# Patient Record
Sex: Male | Born: 1935 | Race: White | Hispanic: No | Smoking: Never smoker
Health system: Southern US, Community
[De-identification: ages and names within clinical notes are randomized; demographics above are authoritative.]

## PROBLEM LIST (undated history)

## (undated) DIAGNOSIS — I639 Cerebral infarction, unspecified: Secondary | ICD-10-CM

## (undated) DIAGNOSIS — R339 Retention of urine, unspecified: Secondary | ICD-10-CM

## (undated) DIAGNOSIS — T24229A Burn of second degree of unspecified knee, initial encounter: Secondary | ICD-10-CM

## (undated) DIAGNOSIS — S41109A Unspecified open wound of unspecified upper arm, initial encounter: Secondary | ICD-10-CM

## (undated) DIAGNOSIS — H9190 Unspecified hearing loss, unspecified ear: Secondary | ICD-10-CM

## (undated) DIAGNOSIS — IMO0001 Reserved for inherently not codable concepts without codable children: Secondary | ICD-10-CM

## (undated) DIAGNOSIS — F0390 Unspecified dementia without behavioral disturbance: Secondary | ICD-10-CM

## (undated) HISTORY — PX: HEMORRHOID SURGERY: SHX153

## (undated) HISTORY — PX: CATARACT EXTRACTION: SUR2

---

## 1997-07-11 ENCOUNTER — Encounter: Admission: RE | Admit: 1997-07-11 | Discharge: 1997-10-09 | Payer: Self-pay | Admitting: Orthopedic Surgery

## 1997-07-25 ENCOUNTER — Inpatient Hospital Stay (HOSPITAL_COMMUNITY): Admission: RE | Admit: 1997-07-25 | Discharge: 1997-07-30 | Payer: Self-pay | Admitting: Orthopedic Surgery

## 1997-08-05 ENCOUNTER — Other Ambulatory Visit: Admission: RE | Admit: 1997-08-05 | Discharge: 1997-08-05 | Payer: Self-pay | Admitting: Orthopedic Surgery

## 1997-08-13 ENCOUNTER — Other Ambulatory Visit: Admission: RE | Admit: 1997-08-13 | Discharge: 1997-08-13 | Payer: Self-pay | Admitting: Orthopedic Surgery

## 1997-12-04 ENCOUNTER — Ambulatory Visit: Admission: RE | Admit: 1997-12-04 | Discharge: 1997-12-04 | Payer: Self-pay | Admitting: Family Medicine

## 1999-01-15 ENCOUNTER — Ambulatory Visit (HOSPITAL_COMMUNITY): Admission: RE | Admit: 1999-01-15 | Discharge: 1999-01-15 | Payer: Self-pay | Admitting: *Deleted

## 2000-09-16 ENCOUNTER — Encounter: Payer: Self-pay | Admitting: Neurosurgery

## 2000-09-16 ENCOUNTER — Encounter: Payer: Self-pay | Admitting: Emergency Medicine

## 2000-09-16 ENCOUNTER — Inpatient Hospital Stay (HOSPITAL_COMMUNITY): Admission: EM | Admit: 2000-09-16 | Discharge: 2000-09-20 | Payer: Self-pay | Admitting: Emergency Medicine

## 2000-09-18 ENCOUNTER — Encounter: Payer: Self-pay | Admitting: Neurosurgery

## 2000-10-21 ENCOUNTER — Encounter: Payer: Self-pay | Admitting: Neurosurgery

## 2000-10-21 ENCOUNTER — Encounter: Admission: RE | Admit: 2000-10-21 | Discharge: 2000-10-21 | Payer: Self-pay | Admitting: Neurosurgery

## 2000-12-30 ENCOUNTER — Encounter: Payer: Self-pay | Admitting: Neurosurgery

## 2000-12-30 ENCOUNTER — Encounter: Admission: RE | Admit: 2000-12-30 | Discharge: 2000-12-30 | Payer: Self-pay | Admitting: Neurosurgery

## 2001-10-12 ENCOUNTER — Inpatient Hospital Stay (HOSPITAL_COMMUNITY): Admission: EM | Admit: 2001-10-12 | Discharge: 2001-10-16 | Payer: Self-pay | Admitting: Emergency Medicine

## 2001-10-12 ENCOUNTER — Encounter (INDEPENDENT_AMBULATORY_CARE_PROVIDER_SITE_OTHER): Payer: Self-pay | Admitting: Cardiology

## 2001-10-12 ENCOUNTER — Encounter: Payer: Self-pay | Admitting: Emergency Medicine

## 2001-10-12 ENCOUNTER — Encounter: Payer: Self-pay | Admitting: *Deleted

## 2001-10-14 ENCOUNTER — Encounter: Payer: Self-pay | Admitting: *Deleted

## 2001-12-23 ENCOUNTER — Emergency Department (HOSPITAL_COMMUNITY): Admission: EM | Admit: 2001-12-23 | Discharge: 2001-12-24 | Payer: Self-pay | Admitting: Emergency Medicine

## 2001-12-24 ENCOUNTER — Encounter: Payer: Self-pay | Admitting: Emergency Medicine

## 2002-02-25 ENCOUNTER — Emergency Department (HOSPITAL_COMMUNITY): Admission: EM | Admit: 2002-02-25 | Discharge: 2002-02-25 | Payer: Self-pay | Admitting: Emergency Medicine

## 2002-02-25 ENCOUNTER — Encounter: Payer: Self-pay | Admitting: Emergency Medicine

## 2002-09-26 ENCOUNTER — Observation Stay (HOSPITAL_COMMUNITY): Admission: EM | Admit: 2002-09-26 | Discharge: 2002-09-27 | Payer: Self-pay | Admitting: Emergency Medicine

## 2002-09-26 ENCOUNTER — Encounter: Payer: Self-pay | Admitting: Internal Medicine

## 2002-09-27 ENCOUNTER — Encounter (INDEPENDENT_AMBULATORY_CARE_PROVIDER_SITE_OTHER): Payer: Self-pay | Admitting: Cardiology

## 2003-04-11 ENCOUNTER — Encounter: Admission: RE | Admit: 2003-04-11 | Discharge: 2003-04-11 | Payer: Self-pay | Admitting: Dermatology

## 2003-07-01 ENCOUNTER — Inpatient Hospital Stay (HOSPITAL_COMMUNITY): Admission: AD | Admit: 2003-07-01 | Discharge: 2003-07-07 | Payer: Self-pay | Admitting: Orthopedic Surgery

## 2004-06-01 ENCOUNTER — Encounter: Admission: RE | Admit: 2004-06-01 | Discharge: 2004-06-01 | Payer: Self-pay | Admitting: Gastroenterology

## 2005-09-30 ENCOUNTER — Ambulatory Visit (HOSPITAL_COMMUNITY): Admission: RE | Admit: 2005-09-30 | Discharge: 2005-09-30 | Payer: Self-pay | Admitting: Urology

## 2006-06-13 ENCOUNTER — Emergency Department (HOSPITAL_COMMUNITY): Admission: EM | Admit: 2006-06-13 | Discharge: 2006-06-14 | Payer: Self-pay | Admitting: Emergency Medicine

## 2006-06-18 ENCOUNTER — Emergency Department (HOSPITAL_COMMUNITY): Admission: EM | Admit: 2006-06-18 | Discharge: 2006-06-18 | Payer: Self-pay | Admitting: Emergency Medicine

## 2006-06-24 ENCOUNTER — Emergency Department (HOSPITAL_COMMUNITY): Admission: EM | Admit: 2006-06-24 | Discharge: 2006-06-24 | Payer: Self-pay | Admitting: Emergency Medicine

## 2006-07-26 ENCOUNTER — Encounter: Admission: RE | Admit: 2006-07-26 | Discharge: 2006-08-24 | Payer: Self-pay | Admitting: Family Medicine

## 2007-02-13 ENCOUNTER — Emergency Department (HOSPITAL_COMMUNITY): Admission: EM | Admit: 2007-02-13 | Discharge: 2007-02-13 | Payer: Self-pay | Admitting: *Deleted

## 2007-04-21 ENCOUNTER — Encounter (HOSPITAL_BASED_OUTPATIENT_CLINIC_OR_DEPARTMENT_OTHER): Admission: RE | Admit: 2007-04-21 | Discharge: 2007-07-20 | Payer: Self-pay | Admitting: Surgery

## 2007-07-28 ENCOUNTER — Encounter (HOSPITAL_BASED_OUTPATIENT_CLINIC_OR_DEPARTMENT_OTHER): Admission: RE | Admit: 2007-07-28 | Discharge: 2007-10-26 | Payer: Self-pay | Admitting: Surgery

## 2007-10-30 ENCOUNTER — Encounter (HOSPITAL_BASED_OUTPATIENT_CLINIC_OR_DEPARTMENT_OTHER): Admission: RE | Admit: 2007-10-30 | Discharge: 2008-01-28 | Payer: Self-pay | Admitting: Internal Medicine

## 2010-05-02 ENCOUNTER — Encounter: Payer: Self-pay | Admitting: Dermatology

## 2010-08-25 NOTE — Discharge Summary (Signed)
NAME:  RACHIT, GRIM NO.:  [id number]   MEDICAL RECORD NO.:  [id number]          PATIENT TYPE:  REC   LOCATION:  FOOT                         FACILITY:  [facility]   PHYSICIAN:  Barry Dienes. Eloise Harman, M.D.DATE OF BIRTH:  1919-05-23   DATE OF ADMISSION:  10/30/2007  DATE OF DISCHARGE:                               DISCHARGE SUMMARY   SUBJECTIVE:  The patient is an 77 year old Caucasian man who has had a  chronic wound on his left elbow.  At his last visit, he had an OASIS  protocol applied that was covered with Mepitel and steri-stripped into  place and then wrapped in fishnet dressing.  He has not had significant  pain in the area and has not had any fever or chills.   OBJECTIVE:  Along the posterior aspect of the left elbow in the center  area of the skin graft there was a large area of eschar that initially  measured  approximately 1.5 cm x 0.6 cm.  There was no surrounding  erythema or exudate.  Today's vital signs include a blood pressure of  124/61, pulse 90, respirations 16, temperature 97.4.   With a #15 scalpel, the eschar was entirely removed, revealing a nearly  resolved ulcer.  The remaining ulcer measured 0.9 cm x 0.5 cm with no  significant depth.  There was a red wound base and no significant  discharge.  There was pink healing tissue surrounding the ulcer.   ASSESSMENT:  Significant improvement in left elbow ulcer.   PLAN:  He was again treated with the OASIS protocol, covered by Mepitel,  Steri-Strips, and a fishnet wrap.  He was advised to have a physician  reevaluation in approximately 1 week.           ______________________________  Barry Dienes. Eloise Harman, M.D.     DGP/MEDQ  D:  11/21/2007  T:  11/21/2007  Job:  [phone]

## 2010-08-25 NOTE — Assessment & Plan Note (Signed)
[facility]   NAME:  KENDAN, CORNFORTH NO.:  [id number]   MEDICAL RECORD NO.:  [id number]      DATE OF BIRTH:  1919/05/09   PHYSICIAN:  Theresia Majors. Tanda Rockers, M.D.      VISIT DATE:                                   OFFICE VISIT   SUBJECTIVE:  Mr. Ahonen is an 77 year old man whom we have been  following for an infective ulcerated bursitis.  In the interim, we have  treated him with daily antibacterial soap and a nonadhesive dressing.  He has been seen by the home health nurse x3 a week.  There has been no  interim fever.  He continues to have a normal range of motion.  There  has been no pain.  He continues to have serous drainage.   OBJECTIVE:  VITAL SIGNS:  Blood pressure 140/72, respiration 16, pulse  rate 108, and temperature 98.4.  He is accompanied by his case Production designer, theatre/television/film.  EXTREMITIES:  Inspection of the left elbow shows that the wound  continues to contract.  There is 100% healthy granulation with advancing  epithelium from the periphery.  There is no longer exposure of the  capsule of the elbow.  The neurovascular exam is normal.   ASSESSMENT:  Clincial improvement.   PLAN:  We will continue the antibacterial soap washings, rinses, and the  nonadhesive dressings.  We will reevaluate him in one month.  The home  health nurse will continue to see him and assist in dressing changes 3  times a week.      Harold A. Tanda Rockers, M.D.  Electronically Signed     HAN/MEDQ  D:  09/19/2007  T:  09/20/2007  Job:  [id number]

## 2010-08-25 NOTE — Assessment & Plan Note (Signed)
[facility]   NAME:  BRAYANT, DORR NO.:  [id number]   MEDICAL RECORD NO.:  [id number]      DATE OF BIRTH:  Nov 18, 1919   PHYSICIAN:  Maxwell Caul, M.D. VISIT DATE:  11/02/2007                                   OFFICE VISIT   [name] is a gentleman who has been followed here for most of this  year.  He has a wound on his left elbow, which is I think initially felt  to be a consequence of a chronic bursitis, for which he was operated on.  I do not have all of these early details at my finger tips at the  present.  In any case, he also has some form of inflammatory dermatitis  over his forearm.  He was able to say that his primary physician, Dr.  Clarene Duke, thought this was eczema.  He does not have it on any other areas  of his body.  He did see a dermatologist who referred him to the [facility]  and he apparently had a biopsy of these areas, perhaps thinking this was  some form of cutaneous lymphoma; however, the biopsy was apparently  negative.  In any case, there is what appears to be some of this waxy  dermatitis actually around the wound.   On examination, he is afebrile.  The wound on his left elbow measures  2.5 x 0.5 x 0.4.  This was surrounded by callus and had a mild amount of  adherent eschar in the base of the wound, all of which I debrided.  I  then applied OASIS saline, covered the area with Mepitel, and a fishnet  wrap.  I advised him not to get this wet for the next week.  The  dermatitis is essentially as described above, some of these appear as  waxy plaques.  I see Dr. Janyth Pupa had attempted to apply hydrocortisone  to this early in the treatment, I am really not sure of the effect.   IMPRESSION:  Surgical wound, left elbow, nonhealing that has had an  OASIS protocol applied, covered in Mepitel and Steri-Stripped into  place.  We then fishnet wrapped this area.  I am going to try and do  some research into what  Dermatology felt was the etiology of some of the  surrounding dermatitis and dermatitis on the left forearm.  We will see  him again in a week.           ______________________________  Maxwell Caul, M.D.     MGR/MEDQ  D:  11/02/2007  T:  11/03/2007  Job:  [id number]

## 2010-08-25 NOTE — Assessment & Plan Note (Signed)
[facility]   NAME:  Dylan Blair, SCHREIER NO.:  [id number]   MEDICAL RECORD NO.:  [id number]      DATE OF BIRTH:  [birth year]   PHYSICIAN:  Theresia Majors. Tanda Rockers, M.D. VISIT DATE:  06/05/2007                                   OFFICE VISIT   SUBJECTIVE:  Mr. Blue is an 77 year old man who we are following for a  left ulcerative bursitis.  In the interim, we have treated him with a  silver matrix dressing.  There has been moderate drainage and some  malodor.  He continues to have a normal range of motion.  He denies  temperature.   OBJECTIVE:  Blood pressure is 138/68, respirations are normal, pulse  rates 104, temperature is 97.4.  Capillary blood glucose is  nonapplicable.  Inspection of the left elbow shows that there are areas  of necrosis surrounded by advancing epithelium and granulation.  The  range of motion is normal.  The radial and brachial pulses are +3.  Under an anesthetic mixture, the wound was debrided utilizing rongeurs  and a 10 blade.  Hemorrhage was controlled with direct pressure.  Thereafter, the wound was irrigated copiously with Dakin's solution  followed by saline solution and moist-moist dressing was applied.   ASSESSMENT:  Clinical improvement of the ulcerative bursitis.   PLAN:  We are discontinuing the silver matrix dressing.  We will begin  daily antiseptic soap washes with Dakin's irrigation and moist -moist  saline dressings.  We will reevaluate the patient in one week.      Harold A. Tanda Rockers, M.D.  Electronically Signed     HAN/MEDQ  D:  06/05/2007  T:  06/06/2007  Job:  [id number]

## 2010-08-25 NOTE — Assessment & Plan Note (Signed)
[facility]   NAME:  Dylan Blair, Dylan Blair NO.:  [id number]   MEDICAL RECORD NO.:  [id number]      DATE OF BIRTH:  [birth year]   PHYSICIAN:  Theresia Majors. Tanda Rockers, M.D. VISIT DATE:  05/08/2007                                   OFFICE VISIT   SUBJECTIVE:  [name] is an 77 year old man who we are treating for  ulcerative bursitis involving his left elbow. In the interim he has been  seen by the [facility] nurse with daily irrigations and packing.  His  management has been complicated by persistent bleeding with each  dressing change.  The patient continues on antiplatelet therapy.  There  has been no fever.  He continues to have full range of motion of the  elbow.   OBJECTIVE:  VITALS:  Blood pressure is 140/66, respirations 16, pulse  rate 90, temperature is 97.6.  Inspection of the left elbow shows that  the wound measurement wise is essentially unchanged.  There is no  evidence of loculation of abscess. Upon manipulation of the inferior rim  of the ulceration there is persistent bleeding from the edge.  The area  was cleansed, painted with Betadine and infiltrated with 1% Xylocaine.  Thereafter multiple horizontal mattress sutures were used to control  bleeding from the edge.  Thereafter the wound was irrigated and a loose  packing a plain new gauze was applied.   ASSESSMENT:  Ulcerative bursitis. No active infection.   PLAN:  We will continue the home health nurses daily irrigation and  loose packing. We will reevaluate the patient in 1 week.  We have not  started him on antibiotics.      Harold A. Tanda Rockers, M.D.  Electronically Signed     HAN/MEDQ  D:  05/08/2007  T:  05/09/2007  Job:  [id number]

## 2010-08-25 NOTE — Assessment & Plan Note (Signed)
[facility]   NAME:  Dylan Blair, Dylan Blair NO.:  [id number]   MEDICAL RECORD NO.:  [id number]      DATE OF BIRTH:  [birth year]   PHYSICIAN:  Theresia Majors. Tanda Rockers, M.D. VISIT DATE:  05/22/2007                                   OFFICE VISIT   SUBJECTIVE:  Dylan Blair is an 77 year old man with ulcerative bursitis  involving his left elbow.  In the interim we have treated him with moist-  to-moist dressings, being administered primarily by the Home Health  nurse.  There has been no interim bleeding.  There has been no pain.  He  maintains a relatively normal range of motion.   OBJECTIVE:  Blood pressure 124/65, respirations 16, pulse rate 90,  temperature 91.  Inspection of the left elbow shows that there has been  some necrosis at the periphery, with some drying out of the fibrous  capsule; with clear advancing granulation tissue at multiple positions  in the periphery.  There is no active drainage.  There is no ascending  cellulitis.  There is no fluctuance or abscess.   We have anesthetized the wound with an EMLA.  Thereafter a sharp  excisional debridement of the nonviable fibrotic subcutaneous tissue was  performed.  Minimum hemorrhage was controlled with direct pressure.   ASSESSMENT:  Clinical improvement.   PLAN:  We will begin using a Prisma silver matrix dressing with  hydrogel, to be changed every 3 days.  We will reevaluate the patient in  2 weeks p.r.n.      Harold A. Tanda Rockers, M.D.  Electronically Signed     HAN/MEDQ  D:  05/22/2007  T:  05/23/2007  Job:  [id number]

## 2010-08-25 NOTE — Assessment & Plan Note (Signed)
[facility]   NAME:  Dylan Blair, Dylan Blair NO.:  [id number]   MEDICAL RECORD NO.:  [id number]      DATE OF BIRTH:  Feb 09, 1920   PHYSICIAN:  Theresia Majors. Tanda Rockers, M.D. VISIT DATE:  05/15/2007                                   OFFICE VISIT   SUBJECTIVE:  Mr. Hollenbach is an 77 year old man who we are treating for an  infected left bursa of the elbow.  During his last visit we over-sewed  the edge of his wound to guard against bleeding and instituted a  continuation of a daily moist-moist packing.  He has continued on this  regimen.  There has been no excessive drainage malodor pain or fever.  He continues to be able to use the elbow.   OBJECTIVE:  Blood pressure is 129/68, respirations 18, pulse rate 85,  temperature 97.6.  Inspection of the left elbow shows that the open wound is clean.  There  is a preponderance of collagenous material that is not malodorous.  There is no necrosis.  A debridement is not needed.  The sutures are  intact and there has been no evidence of recent hemorrhage.   ASSESSMENT:  Improved wound.   PLAN:  We will continue daily irrigations and moist-moist dressings.  We  will reevaluate the patient in 1 week.      Harold A. Tanda Rockers, M.D.  Electronically Signed     HAN/MEDQ  D:  05/15/2007  T:  05/16/2007  Job:  [id number]

## 2010-08-25 NOTE — Group Therapy Note (Signed)
NAME:  Dylan Blair, Dylan Blair NO.:  [id number]   MEDICAL RECORD NO.:  [id number]          PATIENT TYPE:  REC   LOCATION:  FOOT                         FACILITY:  [facility]   PHYSICIAN:  Barry Dienes. Eloise Harman, M.D.DATE OF BIRTH:  12/12/1919                                 PROGRESS NOTE   SUBJECTIVE:  The patient is an 77 year old Caucasian man who at last visit had an  OASIS protocol applied to a left elbow graft that was covered with  Mepitel and a fishnet dressing.  He has not had significant pain in the  area or fever or chills.   OBJECTIVE:  VITAL SIGNS:  Blood pressure 144/69, pulse 84, respirations 16,  temperature 97.8.  Along the lateral aspect of the left elbow there is  an area of thick eschar that when removed, left a very shallow ulcer  measuring 0.4 cm x 0.3 cm x 0.1 cm.  The base of the ulcer was pale pink  in color with some red granulation tissue, no foul odor or significant  drainage.  The periwound integrity was normal.   ASSESSMENT:  Nearly healed left elbow graft ulcer.   PLAN:  The necrotic tissue around the ulcer was removed.  We will apply a thin  layer of Iodosorb covered by Telfa gauze to the area once daily.  He  will be seen in followup in approximately 1 month.           ______________________________  Barry Dienes. Eloise Harman, M.D.     DGP/MEDQ  D:  11/28/2007  T:  11/28/2007  Job:  [id number]

## 2010-08-25 NOTE — Assessment & Plan Note (Signed)
[facility]   NAME:  Dylan Blair, Dylan Blair NO.:  [id number]   MEDICAL RECORD NO.:  [id number]      DATE OF BIRTH:  09/20/1919   PHYSICIAN:  Theresia Majors. Tanda Rockers, M.D. VISIT DATE:  08/22/2007                                   OFFICE VISIT   SUBJECTIVE:  Dylan Blair is an 77 year old man who we followed for  ulcerative bursitis.  In the interim, he has been managed with daily  antibacterial soap washes of 4x4 fishnet dressing.  The home health  nurse continues to see him three times a week.  There has been no  interim excessive drainage, malodor, or fever.  He continues to have  normal range of motion.   OBJECTIVE:  Blood pressure is 129/64, respirations 18, pulse rate 96,  and temperature 98.2.  He is accompanied by the nurse.  Inspection of the left elbow shows a clean healthy-appearing granulating  wound with no evidence of periwound erythema or abscess.  There is a  normal range of motion.  There is no palpable pain.  There is no local  or excessive edema.   ASSESSMENT:  Clinical improvement with local care.   PLAN:  We will continue the use of local care.  We will utilize an  antibacterial soap and a dry dressing.  We will reevaluate the patient  in 3 weeks p.r.n.      Harold A. Tanda Rockers, M.D.  Electronically Signed     HAN/MEDQ  D:  08/22/2007  T:  08/23/2007  Job:  [id number]

## 2010-08-25 NOTE — Assessment & Plan Note (Signed)
[facility]   NAME:  HODGES, TREIBER NO.:  [id number]   MEDICAL RECORD NO.:  [id number]      DATE OF BIRTH:  11/25/1919   PHYSICIAN:  Theresia Majors. Tanda Rockers, M.D. VISIT DATE:  07/04/2007                                   OFFICE VISIT   SUBJECTIVE:  Mr. Toney is an 77 year old man who we have been treating  for ulcerative bursitis involving his left elbow.  In the interim he has  been using a Dakin's irrigation every other day and washing the wound  daily with antiseptic soap.  There has been no fever.  He continues to  have full range of motion.   OBJECTIVE:  Blood pressure is 136/62, respirations 16, pulse of 78,  temperature is 98.  Inspection of the left elbow shows essentially 100%  granulation.  There is no exposed cartilage or tendon.  There is no  undermining.  There is no malodor, no drainage.   ASSESSMENT:  Clinical improvement of the wound.   PLAN:  We will discontinue the Dakin's irrigation and initiate b.i.d.  antiseptic soap washing with a moist dressing.  We will reevaluate the  patient in 2 weeks.      Harold A. Tanda Rockers, M.D.  Electronically Signed     HAN/MEDQ  D:  07/04/2007  T:  07/04/2007  Job:  [id number]

## 2010-08-25 NOTE — Assessment & Plan Note (Signed)
[facility]   NAME:  KEIGEN, CADDELL NO.:  [id number]   MEDICAL RECORD NO.:  [id number]      DATE OF BIRTH:  12-29-1919   PHYSICIAN:  Theresia Majors. Tanda Rockers, M.D. VISIT DATE:  08/01/2007                                   OFFICE VISIT   SUBJECTIVE:  Mr. Broaddus is an 77 year old man who we followed with an  ulcerative bursitis involving his left elbow.  In the interim, he  continues to use antibacterial soap, washes daily.  The home-health  nurse is continuing to see him for irrigations and dressing changes with  more saline 3 times a week.  There has been no excessive drainage,  malodor, pain, or fever.  He continues to have full use of the elbow.   OBJECTIVE:  VITAL SIGNS:  Blood pressure is 125/60, respirations 18,  pulse rate 74, and temperature is 97.5.  EXTREMITIES:  Inspection of the left elbow shows that there is no  excessive edema.  There in no under-matting in the majority of the  circumference of the wound.  There is a 2-mm under-matting bridge  between 11 and 2 o'clock.  The wound is 100% granulated.  The wound is  moist, but it is not excessively draining.  The tissue looks healthy.  There is no local warmth or edema.   ASSESSMENT:  Clinical improvement.   PLAN:  We will continue the use of the antibacterial soap.  We will  continue the visits from the home-health  nurse 3 times a week.  We will  reevaluate the patient in 3 weeks p.r.n.      Harold A. Tanda Rockers, M.D.  Electronically Signed     HAN/MEDQ  D:  08/01/2007  T:  08/02/2007  Job:  [id number]

## 2010-08-25 NOTE — Assessment & Plan Note (Signed)
[facility]   NAME:  TORELL, MINDER NO.:  [id number]   MEDICAL RECORD NO.:  [id number]      DATE OF BIRTH:  [birth year]   PHYSICIAN:  Theresia Majors. Tanda Rockers, M.D. VISIT DATE:  07/18/2007                                   OFFICE VISIT   SUBJECTIVE:  Mr. Cass is an 77 year old man whom we have followed for  an ulcerative bursitis involving the left elbow.  In the interim, he has  utilized an antiseptic soap and daily washings. There has been no pain  in his elbow.  He continues to have essentially a normal range of  motion.  There has been no malodor.  There has been moderate  serosanguineous drainage.  He has had no fever.   OBJECTIVE:  Blood pressure is 132/68, respirations 16, pulse rate 88,  temperature 98.1.  Inspection of the left elbow shows that there is  continued contraction of a healthy granulating wound with a moderate  amount of proteinaceous soft exudate which underwent a selective  debridement utilizing a sponge gauze.  There was no significant  hemorrhage.   ASSESSMENT:  Clinical response of the ulcerative bursitis of the left  elbow.   PLAN:  We will continue his antibacterial soap wash, 4x4 and fish net.  We may decrease his frequency to three times a week for his dressing  changes.  We will see the patient in 2 weeks.  We have advised him that  if he notices an  increase drainage, pain or fever, he should call the  clinic for an interim appointment.  We have given him opportunity to ask  questions.  He seems to understand and expresses gratitude for having  been seen in the clinic and indicates that he will be compliant.      Harold A. Tanda Rockers, M.D.  Electronically Signed     HAN/MEDQ  D:  07/18/2007  T:  07/18/2007  Job:  [id number]

## 2010-08-25 NOTE — Discharge Summary (Signed)
NAME:  Dylan, Blair NO.:  [id number]   MEDICAL RECORD NO.:  [id number]          PATIENT TYPE:  REC   LOCATION:  FOOT                         FACILITY:  [facility]   PHYSICIAN:  Barry Dienes. Eloise Harman, M.D.DATE OF BIRTH:  25-Jul-1919   DATE OF ADMISSION:  10/30/2007  DATE OF DISCHARGE:                               DISCHARGE SUMMARY   SUBJECTIVE:  The patient is an 77 year old Caucasian man who has had a  chronic wound on his left elbow.  Recently, he has been treated with  Oasis covered with Mepitel and wrapped in a fishnet dressing.  He has  not had significant pain and left elbow.  He has requested evaluation by  a new dermatologists for his left forearm rash.   OBJECTIVE:  VITAL SIGNS:  Blood pressure 147/72, pulse 100, respirations  20, temperature 97.9.  EXTREMITIES:  The left elbow region at the skin graft and showed no  further ulceration.  There is a very minimal amount of dry epidermis  flaking at the wound.  There is no significant drainage or foul odor.   ASSESSMENT:  Resolved left elbow ulcer.   PLAN:  He was advised to very gently wash the left elbow with a  washcloth daily.  He is to go to a medical supply store to acquire about  a gel pad to the elbow to prevent re-ulceration.  He plans to seen an  opinion of his left forearm from Dr. Leta Speller.  He was advised to  return to the [facility] as needed if the ulcer recurs.           ______________________________  Barry Dienes Eloise Harman, M.D.     DGP/MEDQ  D:  12/26/2007  T:  12/27/2007  Job:  [id number]

## 2010-08-25 NOTE — Assessment & Plan Note (Signed)
[facility]   NAME:  Dylan Blair, Dylan Blair NO.:  [id number]   MEDICAL RECORD NO.:  [id number]      DATE OF BIRTH:  Aug 25, 1919   PHYSICIAN:  Theresia Majors. Tanda Rockers, M.D. VISIT DATE:  05/01/2007                                   OFFICE VISIT   SUBJECTIVE:  Dylan Blair is an 77 year old man who we have treated for an  infected ulcerative bursitis involving his left elbow. In the interim we  treated him with an aqua cell silver hydrogel and fish net.  We also  recommended over-the-counter cortisone for a psoriatic lesion.  In the  interim the skin lesions have cleared up but there has been some  bleeding from the olecranon bursa area.  There has been no fever.  He  continues to have a normal range of motion.   OBJECTIVE:  The aqua cell silver was removed. This was followed by  oozing consistent with venous bleeding. The area was irrigated, probed  and sounded with a Q-tip.  There is significant undermining. This area  was packed with Iodoform Nu gauze and a bulky dressing applied.  The  packing will serve both as an antiseptic as well as a hemostatic agent.  There is no evidence of ascending lymphangitis or cellulitis.   ASSESSMENT:  Ulcerative infected bursitis.   PLAN:  Culture has been ordered.  We will reevaluate the patient to  remove the packing in the morning.      Harold A. Tanda Rockers, M.D.  Electronically Signed     HAN/MEDQ  D:  05/01/2007  T:  05/01/2007  Job:  [id number]

## 2010-08-25 NOTE — Assessment & Plan Note (Signed)
[facility]   NAME:  AZIR, Dylan NO.:  [id number]   MEDICAL RECORD NO.:  [id number]      DATE OF BIRTH:  30-Jan-1920   PHYSICIAN:  Theresia Majors. Tanda Blair, M.D. VISIT DATE:  06/19/2007                                   OFFICE VISIT   SUBJECTIVE:  Dylan Blair is an 77 year old man who we are following for  an ulcerative bursitis involving his left elbow.  In the interim we have  treated him with a daily antiseptic soap wash followed by Dakin's rinse.  He has been attended by the visiting home nurse.  He returns for follow-  up.  There has been no excessive drainage, malodor, pain or fever.   OBJECTIVE:  VITAL SIGNS:  Blood pressure is 133/69, respirations 16,  pulse rate 73, temperature is 98.1.  The patient is accompanied by his  son and his nurse.  Inspection of the left elbow shows a full range of  motion.  There is an area of necrosis in the superior aspect of the  wound as well as the mid lateral portion.  Under EMLA block, an  excisional debridement was performed with a rongeur with resection of  frankly necrotic tissue, subcutaneous elements and fibrous capsule.  The  patient tolerated this procedure well.  There was no discomfort.  The  resulting wound was adequately debrided and dressed with a moist-moist  dressing and two gauze.   ASSESSMENT:  Clinical improvement of ulcerative bursitis.   PLAN:  We are decreasing the frequency of Dakin's irrigation to every  other day alternating with saline moist-moist technique.  We will  reevaluate the patient in 2 weeks p.r.n.  We are liberalizing the  patient's restrictions on showers.  He has been instructed to shower  twice a day if desired with antiseptic soap to include agitating the  open wound on his left elbow.      Dylan Blair, M.D.  Electronically Signed     HAN/MEDQ  D:  06/19/2007  T:  06/20/2007  Job:  [id number]

## 2010-08-25 NOTE — Consult Note (Signed)
NAME:  [name], BLEW NO.:  [id number]   MEDICAL RECORD NO.:  [id number]          PATIENT TYPE:  REC   LOCATION:  FOOT                         FACILITY:  [facility]   PHYSICIAN:  Theresia Majors. Tanda Rockers, M.D.DATE OF BIRTH:  December 21, 1919   DATE OF CONSULTATION:  04/24/2007  DATE OF DISCHARGE:                                 CONSULTATION   REASON FOR CONSULTATION:  Dylan Blair is an 77 year old man who is  referred by Dr. Catha Gosselin for evaluation of a nonhealing wound over  the left elbow.   IMPRESSION:  Ulcerative bursitis, left elbow.   PLAN:  The wound was cultured in the [facility] and we initiated  Aquacel silver, hydrogel, 4x4 and a fish net gauze dressing.  We have  also recommended an over-the-counter 1/2% hydrocortisone cream to the  inflammatory lesions on the forearm.   SUBJECTIVE:  Dylan Blair is an 77 year old man who has had a swollen and  tender area for as long as 6 years.  Over the last 6 years, this wound  ulcerated. He was seen by a local orthopedists who excised the wound and  treated him with antibiotics and local whirlpool care.  The wound has on  occasion been completely healed but the current ulcerative changes have  been present for 2 years.  He has had no recurrent fevers or spikes. He  continues to have free range of motion of the left elbow. He denies  specific trauma as an etiologic agent.   He has recently completed courses of antibiotics and continues to have a  normal range of motion. He is referred to the wound center for  evaluation and management.   PAST MEDICAL HISTORY:  Remarkable for no known allergies.  He takes over-  the-counter aspirin and Pepcid. His prescription medications include  Plavix and Vesicare.   PAST SURGICAL HISTORY:  Previous surgery has included bilateral knee  replacements, hemorrhoidectomy, bilateral cataract extractions.  The  patient has been fitted for and wears bilateral hearing aids.  He also  had a  subnormal groin hematoma evacuated on two occasions.   FAMILY HISTORY:  Positive for stroke, hypertension and cancer.  Negative  for diabetes and heart attack.   SOCIAL HISTORY:  Socially, he is widowed.  He lives in [place].   REVIEW OF SYSTEMS:  Remarkable for some early satiety and exhaustion,  limited in his walking distance to approximately a block.  He  specifically denies claudication.  There has been no syncope.  There has  been no chest pain.  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  Urologically he has  troublesome urgency and incontinence.  His bowel function is normal. The  remainder of the review of systems is negative.   PHYSICAL EXAM:  He is an alert, appropriate, elderly man, extremely  pleasant and very cooperative.  He is accompanied by his son.  HEENT:  Exam is remarkable for the bilateral hearing aids.  Blood pressure is 128/71, respirations 18, pulse rate 97, temperature is  97.4.  NECK:  Supple.  Trachea is midline.  Thyroid is nonpalpable.  LUNGS:  Clear.  HEART:  Sounds are distant.  ABDOMEN:  Soft.  LOWER EXTREMITIES:  Warm with trace edema.  NEUROLOGIC:  The patient is grossly intact. On the left elbow, there is  a chronic-appearing wound with a halo of desquamation, a  central area  minimum necrosis with exposed fibrous capsule.  There is no drainage.  There is no malodor.  There is no tunneling or abscess.  The wound was  cultured.  Neurologically the extremity sensation and motor abilities  remain intact. There is a full range of motion.  No debridement is  needed.   DISCUSSION:  Dylan Blair is an 77 year old man with a longstanding  history of what appears to be an infected bursa previously. Currently he  has had more ulcerative changes and a relatively clean appearing wound.  We will continue him with a moisturizing dressing and a silver matrix  dressing.  We will reserve the potential  for antibiotic coverage based  on the culture from today.  We have explained our diagnosis of an  ulcerative bursitis to the patient and his son in terms that they seem  to understand.  We have outlined our treatment plan including a silver  matrix dressing, local hygiene, pain control and continuation of range  of motion in terms that they seem to understand.  We have given them  both an opportunity to ask questions.  They express gratitude for having  been seen in the clinic and indicate that they will be compliant.  We  will reevaluate the patient in 1 week.      Harold A. Tanda Rockers, M.D.  Electronically Signed     HAN/MEDQ  D:  04/24/2007  T:  04/25/2007  Job:  [id number]   cc:   Caryn Bee L. Little, M.D.

## 2010-08-28 NOTE — Discharge Summary (Signed)
NAME:  Dylan Blair, DEGROFF NO.:  [id number]   MEDICAL RECORD NO.:  [id number]                   PATIENT TYPE:  INP   LOCATION:  0476                                 [facility]ACILITY:  [facility]   PHYSICIAN:  Sherin Quarry, MD                   DATE OF BIRTH:  03-24-1920   DATE OF ADMISSION:  09/26/2002  DATE OF DISCHARGE:                                 DISCHARGE SUMMARY   HISTORY OF PRESENT ILLNESS:  Dylan Blair is an 77 year old man whose  problems include chronic fatigue, nausea, dyspnea on exertion, previous  history of brainstem stroke, previous history of subdural hematoma,  depression, hypertension, and possible early dementia.  The patient  indicated on admission that he had been experiencing about a six- to eight-  month history of nausea, anorexia, fatigue, and malaise.  He seldom leaves  his house secondary to exhaustion.  When he walks 50-100 feet he says he has  to stop and rest secondary to fatigue.  The patient indicates that he is too  fatigued and exhausted to be able to care for himself at home and that  nursing home placement will be necessary.   PHYSICAL EXAMINATION:  GENERAL:  At the time of admission revealed an  elderly gentleman who was cooperative.  He holds his eyes closed, I believe,  because of persistent diplopia.  HEENT:  Within normal limits.  CHEST:  Clear to auscultation and percussion.  CARDIOVASCULAR:  Revealed normal S1, S2.  Without rubs, murmurs, or gallops.  ABDOMEN:  Benign.  Interval bowel sounds.  No masses, tenderness, or  organomegaly.  NEUROLOGIC:  Testing was remarkable for mild gaze asymmetry.  EXTREMITIES:  Revealed no evidence of cyanosis or edema.   RELEVANT LABORATORY STUDIES:  Normal urinalysis.  Sedimentation rate of 4.  The BNP was within normal limits.  Folate, B12, and thyroid studies were  normal.  CBC and CMET were normal.   A chest x-ray was normal.   A 2-D echocardiogram was obtained on 09/27/2002,  and results of this test are  pending.   On 09/27/2002, [employer] advised the patient that he did not meet  criteria for admission and that his admission status would need to be  changed to observation, and he would need to be discharged.  Therefore, on  09/27/2002, the patient was discharged.   DISCHARGE DIAGNOSES:  1. Chronic fatigue and anorexia, possibly secondary to depression.  2. Dyspnea on exertion, echocardiogram pending.  3. History of brainstem stroke.  4. History of subdural hematoma.  5. Chronic depression.  6. Hypertension.  7. Early dementia.   MEDICATIONS:  The patient will continue his usual medications.  These  consist of:  1. Mavik 2 mg daily.  2. Plavix 75 mg daily.  3. Prozac, which the patient takes in a dose of 20 mg daily.  4. Aspirin 325 mg daily.  5. Aricept 5  mg daily.  6. Tylenol p.r.n.   FOLLOW-UP:  The patient was instructed to follow up with Dr. Catha Gosselin.  I discussed these findings with Dr. Clarene Duke.   CONDITION AT THE TIME OF DISCHARGE:  Fair.                                               Sherin Quarry, MD    SY/MEDQ  D:  09/27/2002  T:  09/27/2002  Job:  [id number]   cc:   Caryn Bee L. Little, M.D.  [address]  [place]  [place] [address]  Fax: [phone]

## 2010-08-28 NOTE — Discharge Summary (Signed)
NAME:  DECKLYN, HYDER NO.:  [id number]   MEDICAL RECORD NO.:  [id number]                   PATIENT TYPE:  INP   LOCATION:  5030                                 FACILITY:  [facility]   PHYSICIAN:  Dionne Ano. Amanda Pea, M.D.             DATE OF BIRTH:  [birth year]   DATE OF ADMISSION:  07/01/2003  DATE OF DISCHARGE:  07/07/2003                                 DISCHARGE SUMMARY   ADMISSION DIAGNOSES:  1. Chronic left elbow ulcer.  2. History of hypertension.  3. History of mini strokes.  4. History of early dementia.   DISCHARGE DIAGNOSES:  1. Chronic left elbow ulcer.  2. History of hypertension.  3. History of mini strokes.  4. History of early dementia.   SURGEON:  Dionne Ano. Amanda Pea, M.D.   PROCEDURE:  I&D of skin, subcutaneous tissue, muscles, and tendons with  complex wound closure over a bony type defect with rotational flap.   CONSULTS:  None.   BRIEF HISTORY AND PHYSICAL:  Mr. Reeder is a very pleasant 77 year old  gentleman who has had a chronic history of a left elbow ulceration over the  olecranon. This has been treated multiple times in the past with I&Ds and in  fact has been treated for infection in the past. He has been seen on a  consistent basis at [facility], undergoing I&Ds at each  office visit and wound care. He has progressed to the point to where primary  closure versus a full thickness skin graft to be performed. He was admitted  prior to his surgery for wound care as well as vac placement. In hope for  further wound closure and to decrease the invasiveness of his surgery, as he  is certainly a frail elderly gentleman who does not have good social  support, and any attempts in decreasing the invasiveness surgery would  certainly be beneficial.   HOSPITAL COURSE:  Mr. Shenberger was admitted on March 21 where a VAC system was  applied to the left elbow. His preoperative labs showed his WBC was 6.6, H  and H 14.3  and 41.6, platelet count 275; PTT 32, PT 13.6, INR 1.1.  Electrolytes showed a sodium of 136, potassium 3.6, calcium 105, chloride  25, BUN and creatinine 15 and 1.0, glucose 144. His albumin was 3.0. UA was  negative. Chest x-ray and EKG showed no acute changes. The patient underwent  wound care over the next three days with vac placement and close monitoring,  and on March 24, he underwent the above procedure without difficulties. A  rotational flap was able to be performed as the continued wound care  certainly helped to minimize the closure; however, this was a complex  closure of the wound. The patient tolerated the procedure very well, and  there was no complications noted. Please see OP report for full details. On  postoperative day #1, he was doing quite well.  His vital signs were stable.  He was afebrile. His drains were discontinued without difficulty. Upper  extremity remained neurovascularly intact. His dressings were clean, dry,  and intact. He had excellent refill and sensation. He continued his hospital  stay over the course of the next two days for IV antibiotics and pain  control. His vital signs remained stable. Intraoperative cultures showed no  growth to date. On July 07, 2003, he was doing very well, and decision was  made to discharge him home in stable condition.   FINAL DIAGNOSES:  1. Status post complex wound closure secondary to a chronic left elbow     ulceration.  2. History of hypertension.  3. History of apparent transient ischemic attacks.  4. History of early dementia.   CONDITION ON DISCHARGE:  Improved.   DISPOSITION:  Home.   DIET:  Regular.   ACTIVITY:  He will keep his splint clean, dry, and intact. Elevate the  extremity. Frequently move his digits as well as massage these.   DISCHARGE MEDICATIONS:  Keflex 500 mg one p.o. t.i.d. for seven days as well  as Vicodin 5/500 one to two p.o. q.4-6h. p.r.n. pain.   FOLLOW UP:  He will follow up  with Dr. Amanda Pea on Friday, April 1 for a wound  check and evaluation.      Karie Chimera, P.A.-C.                   Dionne Ano. Amanda Pea, M.D.    BB/MEDQ  D:  09/23/2003  T:  09/23/2003  Job:  [id number]

## 2010-08-28 NOTE — Op Note (Signed)
[facility]  Patient:    Dylan Blair, Dylan Blair                       MRN: [id number] Proc. Date: 09/16/00 Adm. 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  PROCEDURE:  Bur hole drainage of subdural hematoma on the left side x2 and subgaleal drain placement.  ANESTHESIA:  General endotracheal tube.  ESTIMATED BLOOD LOSS: Minimal.  DRAINS: None.  COMPLICATIONS: None.  SURGEON:  Clydene Fake, M.D.  REASON FOR PROCEDURE:  The patient is a 77 year old gentleman who has been having some gait disturbance over the last couple of weeks and in the last 24 hours some speech problems with more expressive aphasia.  By the time I saw him, he reports that the speech was a bit better, though he had some word-finding problems.  A CT was done showing left chronic subdural hematoma with a midline shift, mass effect, and some membranes within this.  The patient was brought into surgery for evacuation.  PROCEDURE IN DETAIL:  The patient was brought to the operating room, and general anesthesia was induced.  The patient was placed in the [place] head pins, and the patient was prepped and draped in a sterile fashion.  A question mark shaped incision was drawn on the scalp.  This area was injected with 20 cc of 1% lidocaine with epinephrine.  Two incisions were made at the upper part of this drawn incision, one anterior and one posterior.  The incision was taken down to the periosteum.  Hemostasis was obtained with Bovie cauterization.  A bur hole was then made under each incision, and with a high-speed drill.  The dura was bipolared and then opened with a 15 blade at both places, and dark, ______ type fluid was obtained.  We found an inner membrane in both places and were able to  open that up, and it contained even more fluid.  We then irrigated the subdural space until this was clear.  We then tried to strip the inner membrane a little bit more.  The brain was pulsatile but did not re-expand the space.  We continued irrigating the subdural space, and then a subgaleal JP drain was placed over the two bur holes and brought out through a separate posterior stab wound.  This was sutured in with 3-0 nylon suture.  Each incision was then closed with 2-0 Vicryl interrupted sutures in the galea and then staples on the skin.  Because we were able to open up the inner membrane ______, and most of this was very thin, dark fluid, we thought it was not necessary to continue to do a craniotomy at this point.  Dressings were placed.  The patient was removed from the head pins and awakened from anesthesia and transferred to the intensive care unit in stable but fair condition. DD:  09/17/00 TD:  09/18/00 Job: [id number]

## 2010-08-28 NOTE — Discharge Summary (Signed)
[facility]  Patient:    Dylan Blair, Dylan Blair                       MRN: [id number] Adm. Date:  [id number] Disc. Date: [id number] Attending:  Colon Branch                           Discharge Summary  DIAGNOSIS:  Left chronic subdural hematoma.  DISCHARGE DIAGNOSIS:  Left chronic subdural hematoma.  PROCEDURES:  Peripheral drains subdural hematoma on the left x 2 with subgaleal drain.  REASON FOR ADMISSION:  The patient is a 77 year old right-handed white male who had one to two week gait disturbance with 24-hour speech problem.  Came to the ER and CT showed left chronic subdural hematoma with midline shift.  No headache.  No history of trauma.  No seizures.  The patient did have a mild right facial weakness to right pronator drift.  Otherwise neurologically intact.  No sign of anomia, aphasia at time of admission.  The patient was admitted and taken to the operating room for evacuation of left subdural.  HOSPITAL COURSE:  The patient underwent the procedure as named above without complications.  Postoperatively, he was transferred to the intensive care unit there.  He was left flat overnight.  The next day, the face was a little more symmetric.  He had a very slight right pronator drift.  Subgaleal drain did drain out 140 cc the first nine hours.  We kept him flat for another 24 hours.  We obtained a CT on September 18, 2000.  The drain was putting out a lot less fluid. It was removed on September 19, 2000.  CT showed decreased mass-effect but still did have some subdural collection but it was less than half thickness.  There was some air in the subdural space.  Again, a midline shift resolved.  Clinically, the patient was doing extremely well.  He started getting up and moving around.  He was ambulating well without no neurologic problems.  There was a slight pronator drift on the right.  Much improved from preoperative. Face was symmetric.  Ambulating well  and eating well and he was discharged home on September 20, 2000.  DISCHARGE MEDICATIONS:  Same as prehospitalization.  Tylenol p.r.n.  He was having no pain.  ACTIVITY:  No strenuous activity.  DISCHARGE INSTRUCTIONS:  Keep incision dry.  Follow up would be in 10 to 14 days in my office for staple removal. DD:  09/29/00 TD:  09/30/00 Job: [id number]

## 2010-08-28 NOTE — Op Note (Signed)
NAME:  Dylan Blair, Dylan Blair NO.:  [id number]   MEDICAL RECORD NO.:  [id number]                   PATIENT TYPE:  INP   LOCATION:  5030                                 FACILITY:  [facility]   PHYSICIAN:  Dionne Ano. Gramig III, M.D.         DATE OF BIRTH:  12/05/1919   DATE OF PROCEDURE:  07/04/2003  DATE OF DISCHARGE:                                 OPERATIVE REPORT   PREOPERATIVE DIAGNOSIS:  32405, chronic left elbow wound status post  treatment for infection and multiple incision and drainage, now presents for  soft tissue coverage over the exposed defect.   POSTOPERATIVE DIAGNOSIS:  32405, chronic left elbow wound status post  treatment for infection and multiple incision and drainage, now presents for  soft tissue coverage over the exposed defect.   PROCEDURE:  1. Incision and drainage skin, subcutaneous tissue, muscle and tendon, this     was an excisional debridement in nature of the left elbow.  2. Complex wound closure with rotation flap, left elbow.   SURGEON:  Dionne Ano. Amanda Pea, M.D.   ASSISTANT:  Karie Chimera, P.A.-C.   COMPLICATIONS:  None.   DRAINS:  Two.   SPECIMENS:  None.   CULTURES:  Aerobic and anaerobic taken.   INDICATIONS FOR PROCEDURE:  This patient is a very pleasant male who has had  a long history of a nonhealing ulcer about his elbow.  He presented to my  office greater than a month ago and we performed meticulous attention to get  the wound cleaned up so that it would be noninfectious.  The patient  initially had significant problems with an infected look to the elbow.  This  was previously treated by dermatology.  He was referred to my care due to  its nonhealing nature.  We have worked very hard and aggressively on getting  the area cleaned up so as to allow for definitive coverage.  I have  discussed with the patient the risks and benefits of the surgery including  risks of infection, bleeding, anesthesia, damage to  normal structures, and  failure of surgery to accomplish the intended of relieving symptoms and  restoring function.  I have also discussed with the patient the possible  risk of limb loss, etc.,  With this in mind, he desires to proceed.  All  questions were encouraged and answered preoperatively.   PROCEDURE IN DETAIL:  The patient was seen by myself and anesthesia and  taken to the operating suite, underwent a smooth induction of general  anesthesia.  HE was laid supine, appropriately padded, prepped and draped in  the usual sterile fashion about the left upper extremity and the left leg  for possible skin grafting.  Once this was done, the patient had a sterile  field secured after the Betadine scrub and paint.  I then performed an I&D  of the skin, subcutaneous tissue, and tendon.  This was done to my  satisfaction without difficulty.  I mobilized the skin edges, excised a rim  of 1-2 mm of skin in all directions, and aggressively did incision and  drainage of the area.  Following this, we irrigated with greater than 3  liters of saline.  Following this, we inflated the tourniquet and mobilized  the skin.  This was done by undermining between the fascia and subcu in all  directions and making step cuts proximally and distally.  This was mobilized  free without difficulty.  I took care to medially identify the ulnar nerve  and protect this at all times.  The patient tolerated this, as well.  The  patient had no complicating features with this and following this, underwent  deflation of the tourniquet.  I then copiously irrigated again with greater  than 2 liters of fluid and copiously with irrigation made sure the field was  clean, dry, and had robust blood flow, etc.  Following this, the rotation  flap was inset and provided excellent coverage over the elbow.  I was  pleased with this and the findings.  Two 1/8 inch Hemovac drains were  placed, one medially and one laterally to allow  for the egress of fluid and  prevention of hematoma.  Following this, the patient then underwent complex  closure with 3-0 Prolene, far-near and near-far, as well as vertical  mattress sutures were placed.  This was done with the skin under no tension  and with the skin showing excellent blood flow.  I was pleased with this in  the findings.  I should note an additional 2 mm of skin were removed to make  sure that the patient had robust bleeding at the skin edges.  I was pleased  with the integrity of the skin.  The drains were hooked up to suction.  I  did place a combination of lidocaine and Marcaine in the skin edge and  drainage for postop analgesia.  The patient tolerated the procedure well.  Following this, the patient had a sterile dressing applied.  Betadine was  washed away from the skin and Neosporin was placed.  Once this was done, the  patient then underwent placement of a long arm dressing with an anterior  shell to prevent flexion of the elbow.  I should note that with full  flexion, the skin was stable and did not gap open or open up.  I was pleased  with this.  We will watch him closely postoperatively, attend to him in  terms of pain management, IV antibiotics, and other measures.  I have  discussed all issues with the patient.  It has been a pleasure to  participate in his care and look forward to participating in his  postoperative recovery.                                               Dionne Ano. Everlene Other, M.D.    Nash Mantis  D:  07/04/2003  T:  07/05/2003  Job:  [id number]   cc:   Caryn Bee L. Little, M.D.  [address]  [place]  [place] [address]  Fax: [phone]

## 2010-08-28 NOTE — Discharge Summary (Signed)
[facility]  Patient:    Dylan Blair, Dylan Blair Visit Number: [id number] MRN: [id number]          Service Type: MED Location: 3W [id number] [id number] Attending Physician:  Enos Fling Dictated by:   Candy Sledge, M.D. Admit Date:  10/12/2001 Discharge Date: 10/16/2001   CC:         Caryn Bee L. Little, M.D.   Discharge Summary  DISCHARGE DIAGNOSES: 1. 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 2. Right vertebral artery occlusion. 3. History of hypertension, well-controlled. 4. History of subdural hematoma, status post evacuation one year ago.  DISCHARGE MEDICATIONS: 1. Plavix 75 mg p.o. q.d. 2. Aspirin 81 mg p.o. q.d. 3. Mavik 2 mg p.o. q.d. 4. Tylenol PM as needed at night. 5. Calcium supplements.  FOLLOWUP:  Come to to the office in three weeks for followup with Demetrio Lapping, P.A.  Return to see Dr. Catha Gosselin as needed.  HISTORY OF PRESENT ILLNESS:  The patient is a 77 year old, right-handed, widowed male who noted the onset of double vision at approximately 2 p.m. on July 2, and went to [facility] to see Dr. Tery Sanfilippo.  His symptoms cleared over a couple of hours and he was told to monitor his condition.  He went to bed on the night prior to admission, slept fitfully, and noted the alarm clock was double when he would look at it.  Going to the bathroom, he was unsteady on his feet.  He called his son at that point around 7 a.m. to bring him to the emergency room.  The patient denied headache, vertigo, syncope, weakness, numbness, dysarthria, etc.  He noted that his double vision was worse in primary gaze and objects seemed to be side-to-side.  He did admit to some unsteadiness on his feet.  PAST MEDICAL HISTORY: 1. Hypertension, well-controlled. 2. History of subdural hematoma one year ago evacuated by Dr. Phoebe Perch. 3. Status post bilateral TKRs. 4. History of arthritis, previously on  prednisone.  MEDICATIONS: 1. Mavik 2 mg q.d. 2. Aspirin 81 mg p.o. q.d. 3. Calcium supplements. 4. Tylenol PM.  PHYSICAL EXAMINATION:  VITAL SIGNS:  Blood pressure 152/79, pulse 79, respiratory rate 20, temperature 97.0.  GENERAL:  Benign apart from some arthritic changes in his hands.  NEUROLOGIC:  He was alert and oriented and had normal speech.  Cranial nerve examination reveals absent adduction of the right eye with questionable mild ptosis.  There is a slight decrease in adduction of the left eye.  This had the appearance of bilateral INOs, right greater than left.  Strength was normal throughout.  Deep tendon reflexes were 2+ with downgoing plantar responses.  Sensation was intact.  Cerebellar assessment revealed good rapid alternating movements in finger-to-nose.  His stance was wide-based and Romberg was wobbly.  IMPRESSION:  Bilateral internuclear ophthalmoplegia, rule out brain stem infarct.  LABORATORY DATA AND X-RAY FINDINGS:  Admission CBC and differential showed white cell count 7.5, hemoglobin 14.0, hematocrit 40.6, platelet count 241,000; 79% neutrophils, 11% lymphs, 8% monos.  PT and PTT were 12.6 and 34 seconds respectively.  PTT is therapeutic on heparin over the next several days.  Comprehensive metabolic panel was normal apart from glucose of 153. Lipid profile showed cholesterol at 147, triglycerides 116, HDL cholesterol 32 and LDL 92.  CT scan of the head on October 12, 2001, which was negative.  There was some evidence of small vessel ischemic changes.  MRI of the brain  on October 12, 2001, showed no evidence of hyperacute infarct and there was some atrophy with moderate small vessel disease type changes.  MRA showed right vertebral artery occlusion and some mild intracranial atherosclerotic type changes.  Repeat MRI of the brain on July 5, showed an acute infarct in the inferior colliculus, right greater than left.  This is a new finding since October 12, 2001.   A 2D echocardiogram showed no obvious source of emboli.  MR angiography of the neck vessels revealed no evidence of significant carotid artery disease.  HOSPITAL COURSE:  The patient was admitted to the telemetry unit.  Telemetry was discontinued after 48 hours without significant findings.  Vital signs remained essentially stable with normal blood pressures during this brief hospitalization.  The patient continued to note double vision on repeated examinations with continued findings of bilateral Is and Os.  His examination otherwise was quite unremarkable apart from some unsteadiness of gait and he used a walker to ambulate around the unit.  Due to the holiday and weekend, a formal physical therapy consultation was not carried out.  The patient was initially placed on heparin, although at first there was some doubt as to the diagnosis.  The possibility of myasthenia gravis was considered, but his presentation was so much in favor of an acute infarct that a Tensilon test was not performed.  Repeat MRI on July 5, did confirm finding of a stroke.  The patient continued to do well over the next couple of days and was felt to have arrived at maximum hospital benefit by July 7.  He is currently living alone.  He has a son and daughter-in-law close by and they will be helping him.  He was advised that his double vision hopefully will improve over the next six weeks or so, but may not return to normal.  He will be wearing a patch in the meantime and was advised not to drive x3 weeks, at least until he can follow up in the office.  The patient will use his walker at home. Dictated by:   Candy Sledge, M.D. Attending Physician:  Enos Fling DD:  10/16/01 TD:  10/18/01 Job: [id number]

## 2010-08-28 NOTE — H&P (Signed)
NAME:  Dylan Blair, Dylan Blair NO.:  [id number]   MEDICAL RECORD NO.:  [id number]                   PATIENT TYPE:  EMS   LOCATION:  ED                                   FACILITY:  [facility]   PHYSICIAN:  Sherin Quarry, MD                   DATE OF BIRTH:  [birth year]   DATE OF ADMISSION:  09/26/2002  DATE OF DISCHARGE:                                HISTORY & PHYSICAL   Dylan Garbe. Blair is a pleasant, alert, 77 year old gentleman who indicates  that, since his hospitalization for a brain stem stroke in July 2003, he has  been experiencing a progressive deterioration in his overall status.  This  seems to be for several reasons.  First, he is chronically nauseated.  He  has no appetite, and he does not seem to want to eat very much.  It is his  son's perception that he is becoming more dehydrated.  He does not seem to  complain of any specific abdominal pain.  He did have an episode of vomiting  recently.  He has not had any melena or hematochezia.  He had a colonoscopy  a year ago which was remarkable only for polyps. Secondly, the patient  indicates that he feels very weak and becomes exhausted with very minimal  activity.  He wants to get out of the house, but even walking across the  room will make him short of breath. When he walks more than about 50 feet,  he will experience a feeling of marked dyspnea associated ith a feeling of  chest tightness.  In addition, he indicates that he has been experiencing  progressively worsening insomnia.  He will try to go to bed about 12 or 1  a.m., but he finds he can only sleep for a few minutes at a time and then  awakens.  Dr. Clarene Duke has tried to treat this problem by giving him Prozac  which helped transiently but has not really helped very much in the long  run.   The patient's history is significant for several recent problems.  In July  2003. he noted the onset of double vision which failed to resolve.  He,  therefore, presented to [facility] for evaluation.  In the hospital, MRI  scan of the brain showed small vessel disease changes.  MRA scan showed  right vertebral artery occlusion.  A followup MRI scan of the brain shows an  acute infarct of the inferior colliculus, right greater than left.  A 2-D  echocardiogram did not reveal any evidence of emboli.  His neck vessels look  good.  Therefore, the conclusion that was reached was that he had  experienced a brain stem stroke with associated right vertebral artery  occlusion.   Recent history is also remarkable for presentation in July 2002 with acute  onset of mental confusion.  At this time, he was found  to have evidence of a  subdural hematoma.  He underwent a craniotomy to drain this and apparently  tolerated this procedure well.  It is the patient's conception that his  condition has been deteriorating since these two problems occurred.   PAST MEDICAL HISTORY:   ALLERGIES:  He says he might possibly be allergic FELDENE.   CURRENT MEDICATIONS:  1. Mavik 2 mg daily.  2. Plavix 75 mg daily.  3. Prozac 10 mg in the morning, 20 mg in the evening.  4. Aspirin 325 mg daily.  5. Aricept 5 mg daily.  6. Alka Seltzer p.r.n.  7. Tylenol p.r.n.   OPERATION:  1. Hydrocele repair.  2. Hemorrhoid operation.  3. TURP.  4. He has also had the above-mentioned craniotomy for the subdural hematoma.   ILLNESSES:  The patient has been treated for many years for hypertension  which is generally well regulated.   FAMILY HISTORY:  The patient's wife died in 1991-09-16 of a blood cancer.  His  brother had many problems including heart problems, kidney problems, and  some type of intra-abdominal infection.  He has a son who has probable early  COPD.   SOCIAL HISTORY:  The patient has lived by himself since his wife's death.  He said he was coping pretty well until recently.  At this point, he is  quite discouraged with living by himself.  He feels it  is becoming  increasingly more difficult for him to manage.  He feels that some type of  arrangement needs to be made for long-term care, and his family apparently  agrees.  He does not smoke.  He denies history of alcohol or drugs.   REVIEW OF SYSTEMS:  HEAD:  He denies headache or dizziness.  EYES:  He still  has residual diplopia.  EAR, NOSE, AND THROAT:  Denies earache, sinus pain,  or sore throat.  CHEST:  He has a persistent nonproductive cough.  CARDIOVASCULAR:  See above. GI:  There has been no hematemesis, melena, or  hematochezia; otherwise see above.  GU:  The patient thinks that he is  urinating more frequently than usual recently.  NEUROLOGIC:  See above.  There has been no recent history of focal weakness or numbness.  No history  of ataxia.  No history of seizure disorder.  RHEUMATOLOGIC:  There has been  no complaints of back pain or joint pain.   PHYSICAL EXAMINATION:  GENERAL:  He is a somewhat depressed-appearing  gentleman who keeps his eyes closed during examination.  He says this is  because of diplopia.  HEENT:  Exam is within normal limits.  NECK:  Carotids 2+.  CHEST:  Clear.  BACK:  No CVA or point tenderness.  CARDIOVASCULAR:  Normal S1 and S2.  There are no rubs, murmurs, or gallops.  ABDOMEN:  Benign.  Normal bowel sounds without masses, tenderness, or  organomegaly.  NEUROLOGIC:  Within normal limits.  Cranial nerves, motor, sensory, and  cerebellar testing is normal.  EXTREMITIES:  Examination reveals no evidence of cyanosis or edema.  DP and  PT pulses are 2+.   IMPRESSION:  1. Recent syndrome of progressive fatigue, anorexia, nausea of uncertain     etiology.  2. Dyspnea on exertion of uncertain etiology.  3. History of brain stem stroke.  4. History of subdural hematoma.  5. Probable depression.  6. Mild senile dementia.  7. Hypertension.  8. Family history of heart disease.  PLAN:  I thank that we should evaluate  this patient's complaints  by  obtaining appropriate labs including CBC, CMET, B12, folate, thyroid  profile, etc.  A chest x-ray will be obtained.  A 2-D echocardiogram will be  obtained to  assess his cardiac function, and possibly consideration should be given to  doing a Cardiolite stress.  The patient recognizes his need for nursing home  placement.  We will pursue this.  In light of his complaints, would suggest  some mild dehydration.  Will place an IV and advance intravenous fluids for  a brief period.                                               Sherin Quarry, MD    SY/MEDQ  D:  09/26/2002  T:  09/26/2002  Job:  [id number]   cc:   Caryn Bee L. Little, M.D.  [address]  [place]  [place] [address]  Fax: [phone]   C. Lesia Sago, M.D.  [address]  [address]  [place]  [place] [address]  Fax: [phone]

## 2011-01-19 LAB — CBC
Hemoglobin: 14.7
MCHC: 35.2
MCV: 92
Platelets: 190
RDW: 12.3
WBC: 7.1

## 2011-01-19 LAB — BASIC METABOLIC PANEL
Calcium: 9.2
Chloride: 106
Creatinine, Ser: 1.05
GFR calc Af Amer: >60
GFR calc non Af Amer: >60

## 2011-01-19 LAB — POCT CARDIAC MARKERS
CKMB, poc: 1.4
Myoglobin, poc: 199
Troponin i, poc: <0.05

## 2011-01-19 LAB — DIFFERENTIAL
Basophils Relative: 0
Eosinophils Relative: 3
Lymphs Abs: 0.9

## 2011-11-20 ENCOUNTER — Emergency Department (HOSPITAL_BASED_OUTPATIENT_CLINIC_OR_DEPARTMENT_OTHER): Payer: Medicare Other

## 2011-11-20 ENCOUNTER — Encounter (HOSPITAL_BASED_OUTPATIENT_CLINIC_OR_DEPARTMENT_OTHER): Payer: Self-pay | Admitting: Emergency Medicine

## 2011-11-20 ENCOUNTER — Emergency Department (HOSPITAL_BASED_OUTPATIENT_CLINIC_OR_DEPARTMENT_OTHER)
Admission: EM | Admit: 2011-11-20 | Discharge: 2011-11-20 | Disposition: A | Discharge status: Home / Self Care | Payer: Medicare Other | Attending: Emergency Medicine | Admitting: Emergency Medicine

## 2011-11-20 DIAGNOSIS — Z8673 Personal history of transient ischemic attack (TIA), and cerebral infarction without residual deficits: Secondary | ICD-10-CM | POA: Insufficient documentation

## 2011-11-20 DIAGNOSIS — W19XXXA Unspecified fall, initial encounter: Secondary | ICD-10-CM | POA: Insufficient documentation

## 2011-11-20 DIAGNOSIS — S20219A Contusion of unspecified front wall of thorax, initial encounter: Secondary | ICD-10-CM | POA: Insufficient documentation

## 2011-11-20 DIAGNOSIS — Y9210 Unspecified residential institution as the place of occurrence of the external cause: Secondary | ICD-10-CM | POA: Insufficient documentation

## 2011-11-20 HISTORY — DX: Cerebral infarction, unspecified: I63.9

## 2011-11-20 HISTORY — DX: Unspecified hearing loss, unspecified ear: H91.90

## 2011-11-20 HISTORY — DX: Reserved for inherently not codable concepts without codable children: IMO0001

## 2011-11-20 MED ORDER — HYDROCODONE-ACETAMINOPHEN 5-325 MG PO TABS
1.0000 | ORAL_TABLET | Freq: Once | ORAL | Status: AC
  Administered 2011-11-20: 1 via ORAL
  Filled 2011-11-20: qty 1

## 2011-11-20 MED ORDER — HYDROCODONE-ACETAMINOPHEN 5-500 MG PO TABS: 1.0000 | ORAL_TABLET | Freq: Four times a day (QID) | ORAL | Status: AC | PRN

## 2011-11-20 NOTE — ED Notes (Signed)
Per EMS:  Pt from independent living.  Pt fell backwards while using his walker.  No head injury or LOC.  Pt c/o left flank pain.

## 2011-11-20 NOTE — ED Notes (Signed)
States fell while walking with walker about 2 hours ago. Denies feeling dizzy at that time States fell on left side /

## 2011-11-20 NOTE — ED Provider Notes (Signed)
History  This chart was scribed for Rolan Bucco, MD by Dylan Ridgel Blair. This patient was seen in room MH10/MH10 and the patient's care was started at 1832.  CSN: [id number]  Arrival date & time 11/20/11  4098   First MD Initiated Contact with Patient 11/20/11 1847      Chief Complaint  Patient presents with  . Fall  . Flank Pain    Patient is a 78 y.o. male presenting with flank pain. The history is provided by the patient. No language interpreter was used.  Flank Pain Pertinent negatives include no chest pain, no abdominal pain, no headaches and no shortness of breath.  Dylan Blair is a 78 y.o. male brought in by ambulance, who presents to the Emergency Department from independent living complaining of constant left rib pain after he fell while using his walker this PM; no LOC/head trauma. He states he fell on his left arm/left back and has pain to both areas. He denies any LOC, neck pain, recent illnesses, dizziness or light headedness. He states that over his left elbow he has a non-healing wound which is at baseline and has been treated in wound care clinic before without improvement. Denies any head injury  Past Medical History  Diagnosis Date  . Stroke   . Hearing impaired     Past Surgical History  Procedure Date  . Cataract extraction     No family history on file.  History  Substance Use Topics  . Smoking status: Not on file  . Smokeless tobacco: Not on file  . Alcohol Use:       Review of Systems  Constitutional: Negative for fever, chills, diaphoresis and fatigue.  HENT: Negative for congestion, rhinorrhea and sneezing.   Eyes: Negative.   Respiratory: Negative for cough, chest tightness and shortness of breath.   Cardiovascular: Negative for chest pain and leg swelling.  Gastrointestinal: Negative for nausea, vomiting, abdominal pain, diarrhea and blood in stool.  Genitourinary: Negative for frequency, hematuria, flank pain and difficulty urinating.    Musculoskeletal: Positive for back pain (Left lower back pain). Negative for arthralgias.  Skin: Negative for rash.       Wound over his left elbow which is at baseline.   Neurological: Negative for dizziness, speech difficulty, weakness, numbness and headaches.  All other systems reviewed and are negative.    Allergies  Review of patient's allergies indicates no known allergies.  Home Medications   Current Outpatient Rx  Name Route Sig Dispense Refill  . ASPIRIN 81 MG PO TABS Oral Take 81 mg by mouth daily.    Marland Kitchen CLOPIDOGREL BISULFATE 75 MG PO TABS Oral Take 75 mg by mouth daily.    . DONEPEZIL HCL 5 MG PO TABS Oral Take 5 mg by mouth at bedtime as needed.    Marland Kitchen SOLIFENACIN SUCCINATE 10 MG PO TABS Oral Take by mouth daily.    Marland Kitchen VITAMIN A 7500 UNITS PO CAPS Oral Take 7,500 Units by mouth daily.    Marland Kitchen HYDROCODONE-ACETAMINOPHEN 5-500 MG PO TABS Oral Take 1-2 tablets by mouth every 6 (six) hours as needed for pain. 15 tablet 0    Triage Vitals: BP 127/45  Pulse 74  Resp 20  SpO2 97%  Physical Exam  Nursing note and vitals reviewed. Constitutional: He is oriented to person, place, and time. He appears well-developed and well-nourished.  HENT:  Head: Normocephalic and atraumatic.  Eyes: Pupils are equal, round, and reactive to light.  Neck: Normal range of motion.  Neck supple.  Cardiovascular: Normal rate, regular rhythm and normal heart sounds.   Pulmonary/Chest: Effort normal and breath sounds normal. No respiratory distress. He has no wheezes. He has no rales. He exhibits no tenderness.       No signs of trauma to external chest or abdomen.   Abdominal: Soft. Bowel sounds are normal. There is no tenderness. There is no rebound and no guarding.       No pain along the spleen.   Musculoskeletal: Normal range of motion. He exhibits no edema.       Mild tenderness along left mid ribs. No crepitus or deformity. No pain along the spine. No pain on palpation or ROM to X4 extremities.   Lymphadenopathy:    He has no cervical adenopathy.  Neurological: He is alert and oriented to person, place, and time.  Skin: Skin is warm and dry. No rash noted.       Large ulcerated area of his left elbow which he says is chronic and unchanged.   Psychiatric: He has a normal mood and affect.    ED Course  Procedures (including critical care time) DIAGNOSTIC STUDIES: Oxygen Saturation is 97% on room air, adequate by my interpretation.    COORDINATION OF CARE: At 705 PM Discussed treatment plan with patient which includes CXR. Patient agrees.  Dg Chest 2 View  11/20/2011  *RADIOLOGY REPORT*  Clinical Data: Fall, left-sided pain.  CHEST - 2 VIEW  Comparison: 07/01/2003  Findings: Heart size upper normal.  Aortic tortuosity and arch atherosclerotic calcification, similar to prior.  No new areas consolidation.  No pleural effusion or pneumothorax. Hyperinflation with increased AP diameter on the lateral view. Chest radiograph has limited sensitivity for rib fracture detection however no acute displaced fracture is identified.  IMPRESSION: Hyperinflation without focal consolidation or definite acute process.  Original Report Authenticated By: Waneta Martins, M.D.     1. Rib contusion       MDM  No obvious rib fx, no PTX.  No other evident injury.  Will prescribe vicodin, f/u with his PMD   I personally performed the services described in this documentation, which was scribed in my presence.  The recorded information has been reviewed and considered.         Rolan Bucco, MD 11/20/11 2132

## 2011-11-20 NOTE — ED Notes (Signed)
Per daughter in law- pt is being tx at [facility] for left elbow x 1year

## 2013-07-10 ENCOUNTER — Other Ambulatory Visit: Payer: Self-pay | Admitting: Family Medicine

## 2013-07-10 ENCOUNTER — Ambulatory Visit
Admission: RE | Admit: 2013-07-10 | Discharge: 2013-07-10 | Disposition: A | Discharge status: Home / Self Care | Payer: Medicare Other | Source: Ambulatory Visit | Attending: Family Medicine | Admitting: Family Medicine

## 2013-07-10 DIAGNOSIS — M7918 Myalgia, other site: Secondary | ICD-10-CM

## 2013-08-22 ENCOUNTER — Ambulatory Visit (INDEPENDENT_AMBULATORY_CARE_PROVIDER_SITE_OTHER): Payer: Medicare Other | Admitting: Podiatry

## 2013-08-22 ENCOUNTER — Encounter: Payer: Self-pay | Admitting: Podiatry

## 2013-08-22 VITALS — BP 120/70 | HR 73 | Resp 16 | Ht 72.0 in | Wt 165.0 lb

## 2013-08-22 DIAGNOSIS — M79609 Pain in unspecified limb: Secondary | ICD-10-CM

## 2013-08-22 DIAGNOSIS — L02619 Cutaneous abscess of unspecified foot: Secondary | ICD-10-CM

## 2013-08-22 DIAGNOSIS — B351 Tinea unguium: Secondary | ICD-10-CM

## 2013-08-22 DIAGNOSIS — L03119 Cellulitis of unspecified part of limb: Principal | ICD-10-CM

## 2013-08-22 MED ORDER — CEPHALEXIN 500 MG PO CAPS: 500.0000 mg | ORAL_CAPSULE | Freq: Four times a day (QID) | ORAL | Status: DC

## 2013-08-22 NOTE — Patient Instructions (Signed)
Apply triple antibiotic ointment to the skin on the leg foot left 1-2 times daily Wear white cotton sock on left foot leg Began antibiotic by mouth 1 500 mg cephalexin 4 times a day x10 days

## 2013-08-22 NOTE — Progress Notes (Signed)
   Subjective:    Patient ID: Dylan Blair, male    DOB: 1919/06/15, 80 y.o.   MRN: [id number]  HPI Comments: N fungal infection L left dorsal foot D 30 days O  C red, peeling, painful skin, hx of itching A possibly for the Y T Miconazole cream  Pt request to have 1 - 10 toenails debrided.  This  patient presents with caregiver, Dylan Blair who describes appliying topical antifungal cream (miconazole) to the rash on the left foot with persistence of rash from the visit with Dr. Beverley FiedlerVictoria Blair dated 07/03/2013  He is also complaining of painful toenails He was last seen in our office on 08/04/2010 complaining of painful mycotic toenails    Review of Systems  Skin: Positive for wound.       Left elbow [facility]  All other systems reviewed and are negative.      Objective:   Physical Exam Pleasant orientated x3? (does have difficulty responding and answering all questions) hard of hearing white male presents with care giver.  Vascular: DP pulses 2/4 bilaterally PT pulses 2/4 bilaterally  Neurological: Sensation to 10 g monofilament wire intact 2/5 right and 1/5 left Vibratory sensation nonreactive bilaterally Ankle reflexes weakly reactive bilaterally  Dermatological: The distal left lower leg ankle/foot is erythematous, edematous, warm and has scaling  The toenails 1 through 5 bilaterally are elongated, brittle, discolored and hypertrophic.  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

## 2013-08-23 ENCOUNTER — Encounter: Payer: Self-pay | Admitting: Podiatry

## 2013-09-10 ENCOUNTER — Ambulatory Visit: Payer: Medicare Other | Admitting: Podiatry

## 2013-09-11 ENCOUNTER — Ambulatory Visit: Payer: Medicare Other

## 2013-10-01 ENCOUNTER — Ambulatory Visit (INDEPENDENT_AMBULATORY_CARE_PROVIDER_SITE_OTHER): Payer: Medicare Other | Admitting: Podiatry

## 2013-10-01 ENCOUNTER — Encounter: Payer: Self-pay | Admitting: Podiatry

## 2013-10-01 VITALS — BP 127/58 | HR 64 | Temp 98.4°F | Resp 17 | Ht 72.0 in | Wt 175.0 lb

## 2013-10-01 DIAGNOSIS — L03119 Cellulitis of unspecified part of limb: Principal | ICD-10-CM

## 2013-10-01 DIAGNOSIS — L02619 Cutaneous abscess of unspecified foot: Secondary | ICD-10-CM

## 2013-10-01 MED ORDER — CLINDAMYCIN HCL 150 MG PO CAPS: 150.0000 mg | ORAL_CAPSULE | Freq: Four times a day (QID) | ORAL | Status: DC

## 2013-10-01 NOTE — Patient Instructions (Signed)
Start new medicine today clindamycin 150 mg by mouth (1)   4 times a day x10 days

## 2013-10-01 NOTE — Progress Notes (Signed)
Patient ID: Georgiann MohsWilliam C Martinek, male   DOB: 02/03/1920, 80 y.o.   MRN: [id number] Subjective:  80 year old white male appears hardened hearing and is able to answer questions about completion of cephalexin on a 4 times a day basis x10 days from the prescription of 08/23/2013 His caregiver Baldo AshCarl is present in the room today He has completed all all doses except 2 (he has a bottle with him) He denies any complaints from medication  Objective: Dermatological: The dorsal aspect the left foot demonstrates erythema, edema and warmth to the ankle area. There are no open lesions.  Vascular: DP and PTs are 2/4 bilaterally  Neurological: Deferred  Musculoskeletal: No deformities noted  Assessment: Persistence of cellulitis left foot ankle  Plan: DC cephalexin Rx clindamycin 150 mg 4 times a day x10 days  Reevaluate x10 days

## 2013-10-10 ENCOUNTER — Ambulatory Visit: Payer: Medicare Other | Admitting: Podiatry

## 2013-10-31 ENCOUNTER — Ambulatory Visit (INDEPENDENT_AMBULATORY_CARE_PROVIDER_SITE_OTHER): Payer: Medicare Other | Admitting: Podiatry

## 2013-10-31 ENCOUNTER — Encounter: Payer: Self-pay | Admitting: Podiatry

## 2013-10-31 VITALS — BP 116/74 | HR 70 | Temp 96.0°F | Resp 16 | Ht 72.0 in | Wt 170.0 lb

## 2013-10-31 DIAGNOSIS — L02619 Cutaneous abscess of unspecified foot: Secondary | ICD-10-CM

## 2013-10-31 DIAGNOSIS — L03119 Cellulitis of unspecified part of limb: Principal | ICD-10-CM

## 2013-11-01 NOTE — Progress Notes (Signed)
Patient ID: Dylan Blair, male   DOB: 09-Feb-1920, 80 y.o.   MRN: [id number]  Subjective: This patient presents today for followup care for cellulitis in the left foot. He initially presented in 08/23/2013 and completed cephalexin 500 mg 4 times a day except for 2 doses. At that time the dorsal aspect of left foot and ankle demonstrated a low-grade erythema and edema extending to the ankle. On the visit of 10/01/2013 clindamycin 150 mg 4 times a day x10 days was prescribed. Patient states today that he has one more dose from that prescription and describes diarrhea that has resolved with Imodium.  He also requests a private of toenails today  Objective: Hard of hearing oriented x3 presents with caregiver Dylan Blair  Vascular: DP and PT pulses 2/4 bilaterally There is no calf edema or calf tenderness on the left  Dermatological: Residual low-grade edema slight erythema warmth noted on the dorsum of foot ankle area without any open lesions  The toenails are elongated, incurvated and discolored  Assessment: Improving cellulitis left foot/ankle that is clinically improved since the initial visit of 08/23/2013 GI intolerance of clindamycin Patient's relative noncompliance as he was requested to return after 10 days for followup after the visit of 10/01/2013  Plan: The cellulitis appears to have reduced significantly and patient is demonstrating GI intolerance to clindamycin which has resolved. I want him to return in 7 days to confirm that there is no progression and gradual improvement of the cellulitis in the left lower extremity.  Nails x10 were debrided without any bleeding  Report x7 days

## 2013-11-07 ENCOUNTER — Encounter: Payer: Self-pay | Admitting: Podiatry

## 2013-11-07 ENCOUNTER — Ambulatory Visit (INDEPENDENT_AMBULATORY_CARE_PROVIDER_SITE_OTHER): Payer: Medicare Other | Admitting: Podiatry

## 2013-11-07 VITALS — BP 152/67 | HR 72 | Temp 96.7°F | Resp 17 | Ht 72.0 in | Wt 170.0 lb

## 2013-11-07 DIAGNOSIS — L02619 Cutaneous abscess of unspecified foot: Secondary | ICD-10-CM

## 2013-11-07 DIAGNOSIS — L03119 Cellulitis of unspecified part of limb: Principal | ICD-10-CM

## 2013-11-08 ENCOUNTER — Encounter: Payer: Self-pay | Admitting: Podiatry

## 2013-11-08 NOTE — Progress Notes (Signed)
Patient ID: Dylan Blair, male   DOB: 1920/01/11, 80 y.o.   MRN: [id number]  Subjective: This patient presents for followup care for cellulitis left foot /ankle initially presenting on 08/23/2013. He completed cephalexin 500 mg 4 times a day initially without any reduction of symptoms. On the visit of 10/01/2013 clindamycin 150 mg 4 times a day x10 days was prescribed. On the visit of 11/01/2013 patient presented stating that he completed all but one dose of clindamycin as he described diarrhea that resolved with Imodium. At this time patient denies any diarrhea and feels that his left foot is improved  Objective: Hard of hearing orientated x3 white male  Vascular: DP and PT pulses 2/4 bilaterally There is no calf edema or calf tenderness left  Dermatological: The dorsal as the left foot and left ankle demonstrate slight edema, with skin wrinkling without any erythema or warmth. There are no skin lesions noted bilaterally  Assessment: Resolved cellulitis left foot/ankle  Plan: Patient is discharged at this time

## 2014-04-12 DIAGNOSIS — T24229A Burn of second degree of unspecified knee, initial encounter: Secondary | ICD-10-CM

## 2014-04-12 HISTORY — DX: Burn of second degree of unspecified knee, initial encounter: T24.229A

## 2014-04-25 ENCOUNTER — Encounter (HOSPITAL_BASED_OUTPATIENT_CLINIC_OR_DEPARTMENT_OTHER): Payer: Self-pay | Admitting: *Deleted

## 2014-04-25 ENCOUNTER — Emergency Department (HOSPITAL_BASED_OUTPATIENT_CLINIC_OR_DEPARTMENT_OTHER)
Admission: EM | Admit: 2014-04-25 | Discharge: 2014-04-25 | Disposition: A | Discharge status: Home / Self Care | Payer: Medicare Other | Attending: Emergency Medicine | Admitting: Emergency Medicine

## 2014-04-25 DIAGNOSIS — Z792 Long term (current) use of antibiotics: Secondary | ICD-10-CM | POA: Insufficient documentation

## 2014-04-25 DIAGNOSIS — Z79899 Other long term (current) drug therapy: Secondary | ICD-10-CM | POA: Insufficient documentation

## 2014-04-25 DIAGNOSIS — Z8673 Personal history of transient ischemic attack (TIA), and cerebral infarction without residual deficits: Secondary | ICD-10-CM | POA: Insufficient documentation

## 2014-04-25 DIAGNOSIS — T24211A Burn of second degree of right thigh, initial encounter: Secondary | ICD-10-CM | POA: Insufficient documentation

## 2014-04-25 DIAGNOSIS — Y998 Other external cause status: Secondary | ICD-10-CM | POA: Diagnosis not present

## 2014-04-25 DIAGNOSIS — H9190 Unspecified hearing loss, unspecified ear: Secondary | ICD-10-CM | POA: Insufficient documentation

## 2014-04-25 DIAGNOSIS — T24011A Burn of unspecified degree of right thigh, initial encounter: Secondary | ICD-10-CM | POA: Diagnosis present

## 2014-04-25 DIAGNOSIS — Z7982 Long term (current) use of aspirin: Secondary | ICD-10-CM | POA: Insufficient documentation

## 2014-04-25 DIAGNOSIS — X101XXA Contact with hot food, initial encounter: Secondary | ICD-10-CM | POA: Insufficient documentation

## 2014-04-25 DIAGNOSIS — Y9389 Activity, other specified: Secondary | ICD-10-CM | POA: Diagnosis not present

## 2014-04-25 DIAGNOSIS — Y9289 Other specified places as the place of occurrence of the external cause: Secondary | ICD-10-CM | POA: Insufficient documentation

## 2014-04-25 DIAGNOSIS — Z7902 Long term (current) use of antithrombotics/antiplatelets: Secondary | ICD-10-CM | POA: Insufficient documentation

## 2014-04-25 MED ORDER — SILVER SULFADIAZINE 1 % EX CREA
TOPICAL_CREAM | Freq: Once | CUTANEOUS | Status: AC
  Administered 2014-04-25: 10:00:00 via TOPICAL
  Filled 2014-04-25: qty 85

## 2014-04-25 MED ORDER — SILVER SULFADIAZINE 1 % EX CREA: 1.0000 "application " | TOPICAL_CREAM | Freq: Two times a day (BID) | CUTANEOUS | Status: AC

## 2014-04-25 NOTE — ED Notes (Signed)
Called back to [facility], staff is going to go to his apartment and check to see if he has an emergency medication list on his refrigerator, and call me back.

## 2014-04-25 NOTE — ED Notes (Signed)
Attempts to call pt son and contact "Dylan Blair" are unsuccessful, no answer at number listed in chart.

## 2014-04-25 NOTE — ED Notes (Addendum)
[facility] has been called to transport to [facility] and facesheet has be given to Amy

## 2014-04-25 NOTE — ED Notes (Signed)
Attempted to call "Dylan Blair" pt caregiver to verify medications. Message states phone is not in service, pt states "Dylan Blair hasn't had her phone turned on yet...".

## 2014-04-25 NOTE — ED Notes (Signed)
[facility], and given correct phone number for pt caregiver Pattricia Bossnnie, [phone]. Pattricia Bossnnie states she is not sure of pt medications, states I should call her husband to verify, Baldo AshCarl at [phone]. Phoned Baldo AshCarl, he states pt is in independent living and manages his own medications. Neither Pattricia BossAnnie nor Baldo AshCarl know if pt takes plavix or any other of his listed medications, nor what pharmacy he uses.
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
## 2014-04-25 NOTE — ED Notes (Signed)
[facility] states they have a [facility] psychologist[facility] van, but it is not available right now. This rn advised by "Ernie" to call [facility] for pt transport back to his apartment. Pattricia Bossnnie states she cannot transport pt.
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
## 2014-04-25 NOTE — ED Notes (Signed)
[facility] to be transported back to his apartment at [facility].

## 2014-04-25 NOTE — ED Notes (Signed)
Dylan Blair updated on pt discharge. States she will meet [facility] at pt apartment, all d/c instructions reviewed with Dylan Blair, along with instructions for applying Silver Sulfadene cream to burns. [name] verbalizes understanding of all d/c instructions.

## 2014-04-25 NOTE — Discharge Instructions (Signed)
Silvadene dressing changes twice daily.  Follow-up with plastic surgery in the next 3 days. The contact information for Dr. Kelly SplinterSanger has been provided in this discharge summary. Call to arrange this appointment.  Return to the emergency department if you develop increasing pain, redness, fever, or other new and concerning symptoms.   Burn Care Your skin is a natural barrier to infection. It is the largest organ of your body. Burns damage this natural protection. To help prevent infection, it is very important to follow your caregiver's instructions in the care of your burn. Burns are classified as:  First degree. There is only redness of the skin (erythema). No scarring is expected.  Second degree. There is blistering of the skin. Scarring may occur with deeper burns.  Third degree. All layers of the skin are injured, and scarring is expected. HOME CARE INSTRUCTIONS   Wash your hands well before changing your bandage.  Change your bandage as often as directed by your caregiver.  Remove the old bandage. If the bandage sticks, you may soak it off with cool, clean water.  Cleanse the burn thoroughly but gently with mild soap and water.  Pat the area dry with a clean, dry cloth.  Apply a thin layer of antibacterial cream to the burn.  Apply a clean bandage as instructed by your caregiver.  Keep the bandage as clean and dry as possible.  Elevate the affected area for the first 24 hours, then as instructed by your caregiver.  Only take over-the-counter or prescription medicines for pain, discomfort, or fever as directed by your caregiver. SEEK IMMEDIATE MEDICAL CARE IF:   You develop excessive pain.  You develop redness, tenderness, swelling, or red streaks near the burn.  The burned area develops yellowish-white fluid (pus) or a bad smell.  You have a fever. MAKE SURE YOU:   Understand these instructions.  Will watch your condition.  Will get help right away if you are  not doing well or get worse. Document Released: 03/29/2005 Document Revised: 06/21/2011 Document Reviewed: 08/19/2010 [facility] Patient Information 2015 [employer] [place]. This information is not intended to replace advice given to you by your health care provider. Make sure you discuss any questions you have with your health care provider.

## 2014-05-01 ENCOUNTER — Emergency Department (HOSPITAL_COMMUNITY)
Admission: EM | Admit: 2014-05-01 | Discharge: 2014-05-01 | Disposition: A | Discharge status: Home / Self Care | Payer: Medicare Other | Attending: Emergency Medicine | Admitting: Emergency Medicine

## 2014-05-01 ENCOUNTER — Encounter (HOSPITAL_COMMUNITY): Payer: Self-pay

## 2014-05-01 ENCOUNTER — Emergency Department (HOSPITAL_COMMUNITY): Payer: Medicare Other

## 2014-05-01 DIAGNOSIS — Z8673 Personal history of transient ischemic attack (TIA), and cerebral infarction without residual deficits: Secondary | ICD-10-CM | POA: Diagnosis not present

## 2014-05-01 DIAGNOSIS — Z79899 Other long term (current) drug therapy: Secondary | ICD-10-CM | POA: Insufficient documentation

## 2014-05-01 DIAGNOSIS — R410 Disorientation, unspecified: Secondary | ICD-10-CM | POA: Insufficient documentation

## 2014-05-01 DIAGNOSIS — H9190 Unspecified hearing loss, unspecified ear: Secondary | ICD-10-CM | POA: Insufficient documentation

## 2014-05-01 DIAGNOSIS — Z8659 Personal history of other mental and behavioral disorders: Secondary | ICD-10-CM | POA: Diagnosis not present

## 2014-05-01 DIAGNOSIS — R4182 Altered mental status, unspecified: Secondary | ICD-10-CM | POA: Diagnosis present

## 2014-05-01 DIAGNOSIS — Z7902 Long term (current) use of antithrombotics/antiplatelets: Secondary | ICD-10-CM | POA: Insufficient documentation

## 2014-05-01 DIAGNOSIS — Z4801 Encounter for change or removal of surgical wound dressing: Secondary | ICD-10-CM | POA: Diagnosis not present

## 2014-05-01 DIAGNOSIS — R319 Hematuria, unspecified: Secondary | ICD-10-CM | POA: Diagnosis not present

## 2014-05-01 DIAGNOSIS — Z5189 Encounter for other specified aftercare: Secondary | ICD-10-CM

## 2014-05-01 HISTORY — DX: Unspecified dementia, unspecified severity, without behavioral disturbance, psychotic disturbance, mood disturbance, and anxiety: F03.90

## 2014-05-01 LAB — CBC WITH DIFFERENTIAL/PLATELET
Basophils Absolute: 0.1 10*3/uL (ref 0.0–0.1)
Basophils Relative: 1 % (ref 0–1)
Eosinophils Absolute: 0.1 10*3/uL (ref 0.0–0.7)
Eosinophils Relative: 2 % (ref 0–5)
HCT: 40.8 % (ref 39.0–52.0)
Hemoglobin: 14.2 g/dL (ref 13.0–17.0)
Lymphocytes Relative: 14 % (ref 12–46)
Lymphs Abs: 1.1 10*3/uL (ref 0.7–4.0)
MCH: 31.2 pg (ref 26.0–34.0)
MCHC: 34.8 g/dL (ref 30.0–36.0)
MCV: 89.7 fL (ref 78.0–100.0)
Monocytes Absolute: 0.9 10*3/uL (ref 0.1–1.0)
Monocytes Relative: 11 % (ref 3–12)
Neutro Abs: 5.9 10*3/uL (ref 1.7–7.7)
Neutrophils Relative %: 72 % (ref 43–77)
Platelets: 253 10*3/uL (ref 150–400)
RBC: 4.55 MIL/uL (ref 4.22–5.81)
RDW: 12.6 % (ref 11.5–15.5)
WBC: 8 10*3/uL (ref 4.0–10.5)

## 2014-05-01 LAB — BASIC METABOLIC PANEL
Anion gap: 12 (ref 5–15)
BUN: 21 mg/dL (ref 6–23)
CO2: 27 mmol/L (ref 19–32)
Calcium: 9.3 mg/dL (ref 8.4–10.5)
Chloride: 99 mEq/L (ref 96–112)
Creatinine, Ser: 0.95 mg/dL (ref 0.50–1.35)
GFR calc Af Amer: 81 mL/min — ABNORMAL LOW (ref >90)
GFR calc non Af Amer: 70 mL/min — ABNORMAL LOW (ref >90)
Glucose, Bld: 103 mg/dL — ABNORMAL HIGH (ref 70–99)
Potassium: 4.1 mmol/L (ref 3.5–5.1)
Sodium: 138 mmol/L (ref 135–145)

## 2014-05-01 LAB — URINE MICROSCOPIC-ADD ON

## 2014-05-01 LAB — URINALYSIS, ROUTINE W REFLEX MICROSCOPIC
Glucose, UA: NEGATIVE mg/dL
Ketones, ur: NEGATIVE mg/dL
Leukocytes, UA: NEGATIVE
Nitrite: NEGATIVE
Protein, ur: NEGATIVE mg/dL
Specific Gravity, Urine: 1.022 (ref 1.005–1.030)
Urobilinogen, UA: 0.2 mg/dL (ref 0.0–1.0)
pH: 5.5 (ref 5.0–8.0)

## 2014-05-01 LAB — I-STAT CG4 LACTIC ACID, ED: Lactic Acid, Venous: 1.83 mmol/L (ref 0.5–2.2)

## 2014-05-01 LAB — CBG MONITORING, ED: Glucose-Capillary: 87 mg/dL (ref 70–99)

## 2014-05-01 MED ORDER — SILVER SULFADIAZINE 1 % EX CREA
TOPICAL_CREAM | Freq: Two times a day (BID) | CUTANEOUS | Status: DC
  Administered 2014-05-01: 11:00:00 via TOPICAL
  Filled 2014-05-01: qty 85
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
## 2014-05-02 LAB — URINE CULTURE: Colony Count: 45000

## 2014-05-15 ENCOUNTER — Emergency Department (HOSPITAL_COMMUNITY): Payer: Medicare Other

## 2014-05-15 ENCOUNTER — Encounter (HOSPITAL_COMMUNITY): Payer: Self-pay | Admitting: Emergency Medicine

## 2014-05-15 ENCOUNTER — Inpatient Hospital Stay (HOSPITAL_COMMUNITY): Payer: Medicare Other

## 2014-05-15 ENCOUNTER — Inpatient Hospital Stay (HOSPITAL_COMMUNITY)
Admission: EM | Admit: 2014-05-15 | Discharge: 2014-05-17 | DRG: 696 | Disposition: A | Discharge status: Skilled Nursing Facility | Payer: Medicare Other | Attending: Internal Medicine | Admitting: Internal Medicine

## 2014-05-15 DIAGNOSIS — Z8673 Personal history of transient ischemic attack (TIA), and cerebral infarction without residual deficits: Secondary | ICD-10-CM

## 2014-05-15 DIAGNOSIS — N390 Urinary tract infection, site not specified: Secondary | ICD-10-CM | POA: Diagnosis present

## 2014-05-15 DIAGNOSIS — H9193 Unspecified hearing loss, bilateral: Secondary | ICD-10-CM | POA: Diagnosis present

## 2014-05-15 DIAGNOSIS — Z66 Do not resuscitate: Secondary | ICD-10-CM | POA: Diagnosis present

## 2014-05-15 DIAGNOSIS — Z9849 Cataract extraction status, unspecified eye: Secondary | ICD-10-CM

## 2014-05-15 DIAGNOSIS — I639 Cerebral infarction, unspecified: Secondary | ICD-10-CM

## 2014-05-15 DIAGNOSIS — E860 Dehydration: Secondary | ICD-10-CM | POA: Diagnosis present

## 2014-05-15 DIAGNOSIS — T24009A Burn of unspecified degree of unspecified site of unspecified lower limb, except ankle and foot, initial encounter: Secondary | ICD-10-CM | POA: Diagnosis present

## 2014-05-15 DIAGNOSIS — F0390 Unspecified dementia without behavioral disturbance: Secondary | ICD-10-CM | POA: Diagnosis present

## 2014-05-15 DIAGNOSIS — R5381 Other malaise: Secondary | ICD-10-CM | POA: Diagnosis present

## 2014-05-15 DIAGNOSIS — T24222S Burn of second degree of left knee, sequela: Secondary | ICD-10-CM | POA: Diagnosis not present

## 2014-05-15 DIAGNOSIS — T148XXA Other injury of unspecified body region, initial encounter: Secondary | ICD-10-CM

## 2014-05-15 DIAGNOSIS — R338 Other retention of urine: Secondary | ICD-10-CM | POA: Diagnosis present

## 2014-05-15 DIAGNOSIS — N281 Cyst of kidney, acquired: Secondary | ICD-10-CM | POA: Diagnosis present

## 2014-05-15 DIAGNOSIS — I447 Left bundle-branch block, unspecified: Secondary | ICD-10-CM | POA: Diagnosis present

## 2014-05-15 DIAGNOSIS — K5900 Constipation, unspecified: Secondary | ICD-10-CM | POA: Diagnosis present

## 2014-05-15 DIAGNOSIS — L089 Local infection of the skin and subcutaneous tissue, unspecified: Secondary | ICD-10-CM | POA: Insufficient documentation

## 2014-05-15 DIAGNOSIS — T24009D Burn of unspecified degree of unspecified site of unspecified lower limb, except ankle and foot, subsequent encounter: Secondary | ICD-10-CM

## 2014-05-15 DIAGNOSIS — T24211S Burn of second degree of right thigh, sequela: Secondary | ICD-10-CM

## 2014-05-15 DIAGNOSIS — N179 Acute kidney failure, unspecified: Secondary | ICD-10-CM

## 2014-05-15 DIAGNOSIS — T24201A Burn of second degree of unspecified site of right lower limb, except ankle and foot, initial encounter: Secondary | ICD-10-CM

## 2014-05-15 DIAGNOSIS — IMO0001 Reserved for inherently not codable concepts without codable children: Secondary | ICD-10-CM | POA: Insufficient documentation

## 2014-05-15 DIAGNOSIS — R339 Retention of urine, unspecified: Secondary | ICD-10-CM | POA: Diagnosis present

## 2014-05-15 DIAGNOSIS — H9190 Unspecified hearing loss, unspecified ear: Secondary | ICD-10-CM | POA: Insufficient documentation

## 2014-05-15 HISTORY — DX: Unspecified open wound of unspecified upper arm, initial encounter: S41.109A

## 2014-05-15 HISTORY — DX: Retention of urine, unspecified: R33.9

## 2014-05-15 HISTORY — DX: Burn of second degree of unspecified knee, initial encounter: T24.229A

## 2014-05-15 LAB — CBC WITH DIFFERENTIAL/PLATELET
BASOS ABS: 0.1 10*3/uL (ref 0.0–0.1)
Basophils Relative: 1 % (ref 0–1)
EOS PCT: 3 % (ref 0–5)
Eosinophils Absolute: 0.3 10*3/uL (ref 0.0–0.7)
HEMATOCRIT: 36.8 % — AB (ref 39.0–52.0)
HEMOGLOBIN: 12.3 g/dL — AB (ref 13.0–17.0)
LYMPHS ABS: 0.8 10*3/uL (ref 0.7–4.0)
Lymphocytes Relative: 9 % — ABNORMAL LOW (ref 12–46)
MCH: 31.1 pg (ref 26.0–34.0)
MCHC: 33.4 g/dL (ref 30.0–36.0)
MCV: 92.9 fL (ref 78.0–100.0)
MONO ABS: 0.8 10*3/uL (ref 0.1–1.0)
MONOS PCT: 10 % (ref 3–12)
Neutro Abs: 6.6 10*3/uL (ref 1.7–7.7)
Neutrophils Relative %: 77 % (ref 43–77)
Platelets: 226 10*3/uL (ref 150–400)
RBC: 3.96 MIL/uL — ABNORMAL LOW (ref 4.22–5.81)
RDW: 13.3 % (ref 11.5–15.5)
WBC: 8.5 10*3/uL (ref 4.0–10.5)

## 2014-05-15 LAB — BASIC METABOLIC PANEL
Anion gap: 5 (ref 5–15)
BUN: 23 mg/dL (ref 6–23)
CHLORIDE: 111 mmol/L (ref 96–112)
CO2: 26 mmol/L (ref 19–32)
CREATININE: 1.33 mg/dL (ref 0.50–1.35)
Calcium: 8.6 mg/dL (ref 8.4–10.5)
GFR calc Af Amer: 52 mL/min — ABNORMAL LOW (ref >90)
GFR, EST NON AFRICAN AMERICAN: 45 mL/min — AB (ref >90)
GLUCOSE: 105 mg/dL — AB (ref 70–99)
Potassium: 4 mmol/L (ref 3.5–5.1)
Sodium: 142 mmol/L (ref 135–145)

## 2014-05-15 LAB — I-STAT TROPONIN, ED: TROPONIN I, POC: 0.02 ng/mL (ref 0.00–0.08)

## 2014-05-15 LAB — TROPONIN I

## 2014-05-15 MED ORDER — GUAIFENESIN-DM 100-10 MG/5ML PO SYRP
5.0000 mL | ORAL_SOLUTION | ORAL | Status: DC | PRN
  Filled 2014-05-15: qty 5

## 2014-05-15 MED ORDER — BISACODYL 10 MG RE SUPP
10.0000 mg | Freq: Every day | RECTAL | Status: DC
  Administered 2014-05-15 – 2014-05-17 (×2): 10 mg via RECTAL
  Filled 2014-05-15 (×3): qty 1

## 2014-05-15 MED ORDER — ONDANSETRON HCL 4 MG PO TABS: 4.0000 mg | ORAL_TABLET | Freq: Four times a day (QID) | ORAL | Status: DC | PRN

## 2014-05-15 MED ORDER — SILVER SULFADIAZINE 1 % EX CREA
TOPICAL_CREAM | Freq: Every day | CUTANEOUS | Status: DC
  Administered 2014-05-15: 1 via TOPICAL
  Administered 2014-05-16: 2 via TOPICAL
  Administered 2014-05-17: 11:00:00 via TOPICAL
  Filled 2014-05-15: qty 85

## 2014-05-15 MED ORDER — DOXYCYCLINE HYCLATE 100 MG PO TABS
100.0000 mg | ORAL_TABLET | Freq: Two times a day (BID) | ORAL | Status: DC
  Administered 2014-05-15 – 2014-05-16 (×3): 100 mg via ORAL
  Filled 2014-05-15 (×4): qty 1

## 2014-05-15 MED ORDER — VANCOMYCIN HCL IN DEXTROSE 1-5 GM/200ML-% IV SOLN
1000.0000 mg | Freq: Once | INTRAVENOUS | Status: AC
  Administered 2014-05-15: 1000 mg via INTRAVENOUS
  Filled 2014-05-15: qty 200

## 2014-05-15 MED ORDER — DOCUSATE SODIUM 100 MG PO CAPS
200.0000 mg | ORAL_CAPSULE | Freq: Two times a day (BID) | ORAL | Status: DC
  Administered 2014-05-15 – 2014-05-17 (×5): 200 mg via ORAL
  Filled 2014-05-15 (×6): qty 2

## 2014-05-15 MED ORDER — HYDROCODONE-ACETAMINOPHEN 5-325 MG PO TABS: 1.0000 | ORAL_TABLET | ORAL | Status: DC | PRN

## 2014-05-15 MED ORDER — ONDANSETRON HCL 4 MG/2ML IJ SOLN: 4.0000 mg | Freq: Four times a day (QID) | INTRAMUSCULAR | Status: DC | PRN

## 2014-05-15 MED ORDER — ASPIRIN 81 MG PO CHEW
81.0000 mg | CHEWABLE_TABLET | Freq: Every day | ORAL | Status: DC
Start: 2014-05-15 — End: 2014-05-17
  Administered 2014-05-15 – 2014-05-17 (×3): 81 mg via ORAL
  Filled 2014-05-15 (×3): qty 1

## 2014-05-15 MED ORDER — SODIUM CHLORIDE 0.9 % IV BOLUS (SEPSIS)
1000.0000 mL | Freq: Once | INTRAVENOUS | Status: AC
  Administered 2014-05-15: 1000 mL via INTRAVENOUS

## 2014-05-15 MED ORDER — TAMSULOSIN HCL 0.4 MG PO CAPS
0.4000 mg | ORAL_CAPSULE | Freq: Every day | ORAL | Status: DC
  Administered 2014-05-15 – 2014-05-17 (×3): 0.4 mg via ORAL
  Filled 2014-05-15 (×3): qty 1

## 2014-05-15 MED ORDER — SILVER SULFADIAZINE 1 % EX CREA
TOPICAL_CREAM | Freq: Two times a day (BID) | CUTANEOUS | Status: DC
  Filled 2014-05-15: qty 85

## 2014-05-15 MED ORDER — POLYETHYLENE GLYCOL 3350 17 G PO PACK
17.0000 g | PACK | Freq: Two times a day (BID) | ORAL | Status: DC
  Administered 2014-05-15 – 2014-05-17 (×5): 17 g via ORAL
  Filled 2014-05-15 (×6): qty 1

## 2014-05-15 MED ORDER — HEPARIN SODIUM (PORCINE) 5000 UNIT/ML IJ SOLN
5000.0000 [IU] | Freq: Three times a day (TID) | INTRAMUSCULAR | Status: DC
  Administered 2014-05-15 – 2014-05-17 (×7): 5000 [IU] via SUBCUTANEOUS
  Filled 2014-05-15 (×9): qty 1

## 2014-05-15 MED ORDER — ALUM & MAG HYDROXIDE-SIMETH 200-200-20 MG/5ML PO SUSP: 30.0000 mL | Freq: Four times a day (QID) | ORAL | Status: DC | PRN

## 2014-05-15 NOTE — ED Notes (Signed)
Admitting MD at bedside.  Daughter called to update on plan to admit.  She will call his facility.  Dylan RegalCarol [phone].  Patient with no complaints other than being cold.  Warm blankets placed on patient
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
## 2014-05-15 NOTE — ED Notes (Signed)
Patient has returned from xray 
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
## 2014-05-15 NOTE — Progress Notes (Signed)
Clinical Social Work Department BRIEF PSYCHOSOCIAL ASSESSMENT 05/15/2014  Patient:  Dylan Blair,Dylan Blair     Account Number:  [id number]     Admit date:  05/15/2014  Clinical Social Worker:  Andres ShadNAIL,Ivie Maese, LCSW  Date/Time:  05/15/2014 10:51 AM  Referred by:  Physician  Date Referred:  05/15/2014 Referred for  SNF Placement   Other Referral:   Patient admitted from [place].  MD wanting family to having more information on nursing homes in area and higher level of care   Interview type:  Family Other interview type:   Patient has dx of dementia, pleasantly cooperative, however poor historian.  Spoke with daughter in law: Dylan Blair [phone]  [facility]    PSYCHOSOCIAL DATA Living Status:  FACILITY Admitted from facility:   Level of care:  Independent Living Primary support name:  Dylan Blair and Dylan Blair Primary support relationship to patient:  FAMILY Degree of support available:   Dylan Blair and Dylan Blair are patient's sons who share deicsion making ability.  Dylan Blair is the HPOA (financial), both sons make decisions for patient.    High level of support with a caregiver in the [facility]: [place].    CURRENT CONCERNS Current Concerns  Post-Acute Placement   Other Concerns:   Family and MD questioning if patient needs a higher level of care.  Patient uses a walker, but has dementia. Progressively getting weaker with medical problems    SOCIAL WORK ASSESSMENT / PLAN LCSW attempted to meet with patient at the bedside. patient very anxious about being in the hospital reporting "I need to tell them I am here" and "call Mrs. Pattricia Bossnnie, she takes care of me". [facility] per DIL: Dylan Blair.  Patient has been there for over 5 years. Patient has a caregiver that helps him live in [place]. Per Dylan Blair, family is interested in learning more about higher level of care and obtaining information. Per Dylan Blair, he will return to the [facility] at  [facility] until family can decide on his next residence and make the decision.  LCSW discussed role and ability to place patient in higher level while inpatient if he qualifies.  Family is interested in learning more about this, but at this time do not want to make a decision in the ED about nursing home.  Family is coming today as patient is being admitted to [facility].  LCSW will transfer case to 5N25 CSW and have them follow up with quesitons or concerns regarding disposition and needs   Assessment/plan status:  Psychosocial Support/Ongoing Assessment of Needs Other assessment/ plan:   ?PT/OT to determine if patient is capable of rehab SNF   Information/referral to community resources:   ?FL2 if patient to be discharged to higher level of care  Passar request as well    PATIENT'S/FAMILY'S RESPONSE TO PLAN OF CARE: Family very appreciative of call and calming patient. patient's anxiety subsided during discussion with this Clinical research associatewriter and understanding people know where he currently is located and needs.  Plan of care discussed with DIL who is in agreement to following up with LCSW on unit.  No concerns or needs at this time.     Dylan EmoryHannah Jobani Sabado LCSW, MSW Clinical Social Work: Emergency Room [phone]
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
## 2014-05-16 DIAGNOSIS — R339 Retention of urine, unspecified: Principal | ICD-10-CM

## 2014-05-16 LAB — BASIC METABOLIC PANEL
Anion gap: 5 (ref 5–15)
BUN: 20 mg/dL (ref 6–23)
CHLORIDE: 113 mmol/L — AB (ref 96–112)
CO2: 24 mmol/L (ref 19–32)
CREATININE: 1.02 mg/dL (ref 0.50–1.35)
Calcium: 8.3 mg/dL — ABNORMAL LOW (ref 8.4–10.5)
GFR calc non Af Amer: 62 mL/min — ABNORMAL LOW (ref >90)
GFR, EST AFRICAN AMERICAN: 71 mL/min — AB (ref >90)
GLUCOSE: 96 mg/dL (ref 70–99)
Potassium: 3.6 mmol/L (ref 3.5–5.1)
Sodium: 142 mmol/L (ref 135–145)

## 2014-05-16 LAB — CBC
HCT: 32.9 % — ABNORMAL LOW (ref 39.0–52.0)
HEMOGLOBIN: 11.1 g/dL — AB (ref 13.0–17.0)
MCH: 31.4 pg (ref 26.0–34.0)
MCHC: 33.7 g/dL (ref 30.0–36.0)
MCV: 93.2 fL (ref 78.0–100.0)
Platelets: 209 10*3/uL (ref 150–400)
RBC: 3.53 MIL/uL — AB (ref 4.22–5.81)
RDW: 13.5 % (ref 11.5–15.5)
WBC: 7.2 10*3/uL (ref 4.0–10.5)
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
## 2014-05-16 NOTE — Clinical Social Work Note (Signed)
CSW provided patient's HCPOA / son Orvilla Fus[Tommy, [phone]] with SNF bed offers.  Patient's HCPOA / son to review bed offers and provide CSW with choice.  CSW to continue to follow and assist with discharge planning needs.  Marcelline Deistmily Sequoyah Ramone, LCSWA [phone]) Licensed Clinical Social Worker Orthopedics [phone]([id number]) and Surgical [phone]([id number])

## 2014-05-16 NOTE — Progress Notes (Signed)
Dressing change applied ob both thighs per order. Pt is pleasant and  sates the wound size getting smaller and better. Will continue to monitor.
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
## 2014-05-16 NOTE — Clinical Social Work Placement (Signed)
Clinical Social Work Department CLINICAL SOCIAL WORK PLACEMENT NOTE 05/16/2014  Patient:  Dylan Blair,Dylan Blair  Account Number:  [id number] Admit date:  05/15/2014  Clinical Social Worker:  Hortencia PilarKIERRA Lanny Donoso, CLINICAL SOCIAL WORKER  Date/time:  05/16/2014 10:40 AM  Clinical Social Work is seeking post-discharge placement for this patient at the following level of care:   SKILLED NURSING   (*CSW will update this form in Epic as items are completed)   05/16/2014  Patient/family provided with [facility] Department of Clinical Social Work's list of facilities offering this level of care within the geographic area requested by the patient (or if unable, by the patient's family).  05/16/2014  Patient/family informed of their freedom to choose among providers that offer the needed level of care, that participate in Medicare, Medicaid or managed care program needed by the patient, have an available bed and are willing to accept the patient.  05/16/2014  Patient/family informed of [facility]' ownership interest in [facility], as well as of the fact that they are under no obligation to receive care at this facility.  PASARR submitted to EDS on 05/16/2014 PASARR number received on 05/16/2014  FL2 transmitted to all facilities in geographic area requested by pt/family on  05/16/2014 FL2 transmitted to all facilities within larger geographic area on   Patient informed that his/her managed care company has contracts with or will negotiate with  certain facilities, including the following:     Patient/family informed of bed offers received:   Patient chooses bed at  Physician recommends and patient chooses bed at    Patient to be transferred to  on   Patient to be transferred to facility by  Patient and family notified of transfer on  Name of family member notified:    The following physician request were entered in Epic:   Additional Comments:   Avy Barlett S. Deaisha Welborn, BSW-Intern

## 2014-05-17 DIAGNOSIS — R5381 Other malaise: Secondary | ICD-10-CM

## 2014-05-17 DIAGNOSIS — R338 Other retention of urine: Secondary | ICD-10-CM

## 2014-05-17 DIAGNOSIS — N390 Urinary tract infection, site not specified: Secondary | ICD-10-CM | POA: Diagnosis present

## 2014-05-17 DIAGNOSIS — R319 Hematuria, unspecified: Secondary | ICD-10-CM

## 2014-05-17 DIAGNOSIS — K5900 Constipation, unspecified: Secondary | ICD-10-CM | POA: Diagnosis present

## 2014-05-17 LAB — URINALYSIS, ROUTINE W REFLEX MICROSCOPIC
BILIRUBIN URINE: NEGATIVE
Glucose, UA: NEGATIVE mg/dL
KETONES UR: NEGATIVE mg/dL
Nitrite: NEGATIVE
Protein, ur: NEGATIVE mg/dL
Specific Gravity, Urine: 1.018 (ref 1.005–1.030)
UROBILINOGEN UA: 0.2 mg/dL (ref 0.0–1.0)
pH: 5.5 (ref 5.0–8.0)

## 2014-05-17 LAB — URINE MICROSCOPIC-ADD ON

## 2014-05-17 MED ORDER — TAMSULOSIN HCL 0.4 MG PO CAPS: 0.4000 mg | ORAL_CAPSULE | Freq: Every day | ORAL | Status: AC

## 2014-05-17 MED ORDER — DOCUSATE SODIUM 100 MG PO CAPS: 200.0000 mg | ORAL_CAPSULE | Freq: Two times a day (BID) | ORAL | Status: AC

## 2014-05-17 MED ORDER — POLYETHYLENE GLYCOL 3350 17 G PO PACK: 17.0000 g | PACK | Freq: Every day | ORAL | Status: AC | PRN

## 2014-05-17 MED ORDER — ASPIRIN 81 MG PO CHEW: 81.0000 mg | CHEWABLE_TABLET | Freq: Every day | ORAL | Status: AC

## 2014-05-17 MED ORDER — SULFAMETHOXAZOLE-TRIMETHOPRIM 800-160 MG PO TABS: 1.0000 | ORAL_TABLET | Freq: Two times a day (BID) | ORAL | Status: AC

## 2014-05-17 MED ORDER — SULFAMETHOXAZOLE-TRIMETHOPRIM 800-160 MG PO TABS
1.0000 | ORAL_TABLET | Freq: Two times a day (BID) | ORAL | Status: DC
  Administered 2014-05-17: 1 via ORAL
  Filled 2014-05-17: qty 1

## 2014-05-17 MED ORDER — BISACODYL 10 MG RE SUPP: 10.0000 mg | Freq: Every day | RECTAL | Status: AC | PRN

## 2014-05-17 NOTE — Progress Notes (Signed)
Utilization review completed. Uriel Horkey, RN, BSN. 

## 2014-05-17 NOTE — Clinical Social Work Note (Signed)
[facility]. Patient's son / Netta CorriganHCPOA, Tommy, updated regarding discharge.  Blue Medicare authorization: [id number]  *Patient's room available after 3pm today*  [facility] Report number: [phone] Transportation: Patient's son, Zollie BeckersWalter, providing transportation.  Marcelline Deistmily Caeleigh Pro[place]aska, LCSWA [phone]) Licensed Clinical Social Worker Orthopedics [phone](5N17-32) and Surgical [phone](6N17-32)

## 2014-05-17 NOTE — Plan of Care (Signed)
Problem: Phase III Progression Outcomes Goal: Voiding independently Outcome: Not Applicable Date Met:  68/12/75 Foley to stay in for about a week.

## 2014-05-17 NOTE — Plan of Care (Signed)
Problem: Phase I Progression Outcomes Goal: Voiding-avoid urinary catheter unless indicated Outcome: Not Met (add Reason) Patient has foley catheter for urinary retention     
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
## 2014-05-17 NOTE — Progress Notes (Addendum)
Patient ready for discharge. Family will provide transportation. Called report to Durward ParcelAnnie Miller, [facility] and left call back number if she has any further questions.

## 2014-07-12 DEATH — deceased

## 2017-01-16 IMAGING — CT CT HEAD W/O CM
2 series · 15 of 30 positions shown, 19 images · non-contrast
Comparison: 11/22/2012

CLINICAL DATA: Altered mental status, confusion

EXAM:
CT HEAD WITHOUT CONTRAST
TECHNIQUE: Contiguous axial images were obtained from the base of the skull
through the vertex without intravenous contrast.

[Series 201: head w/o, idose (1) · axial · non-contrast · 0.49mm/px · z∈[+74,+209]mm · 13 of 33 slices shown, 17 images]
[im 3/33  brain]
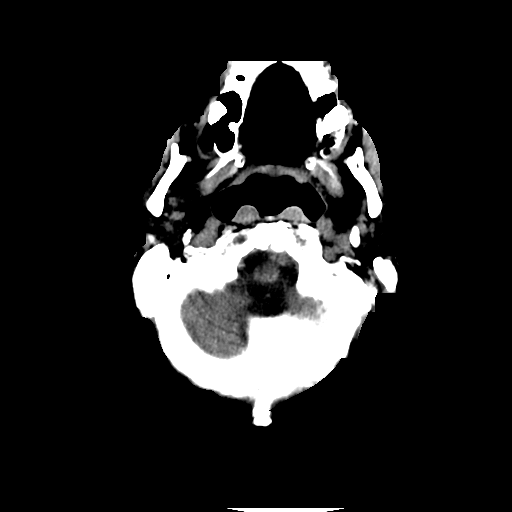
[im 3/33  bone]
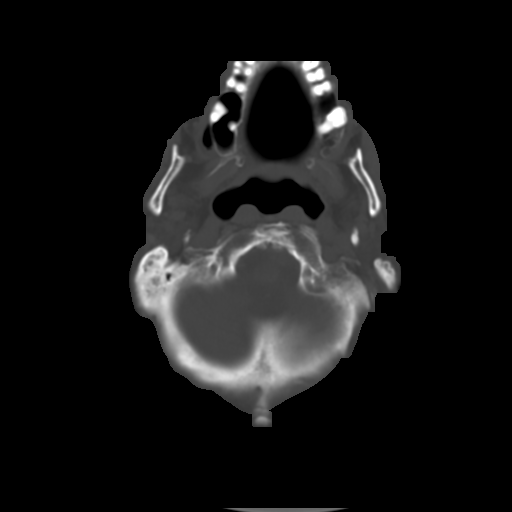
[im 5/33  brain]
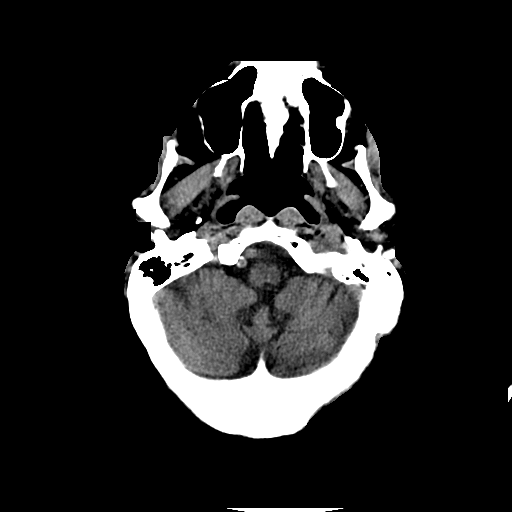
[im 7/33  brain]
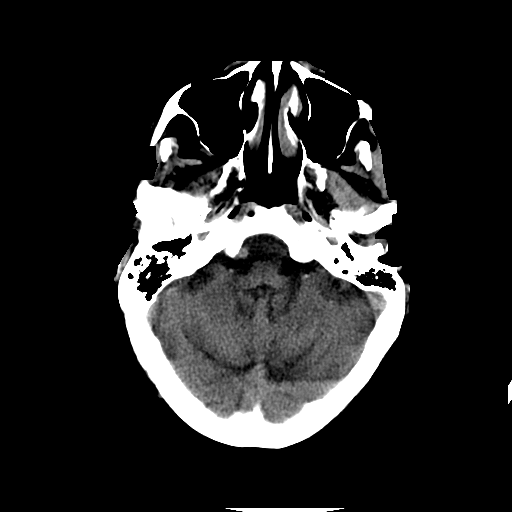
[im 10/33  brain]
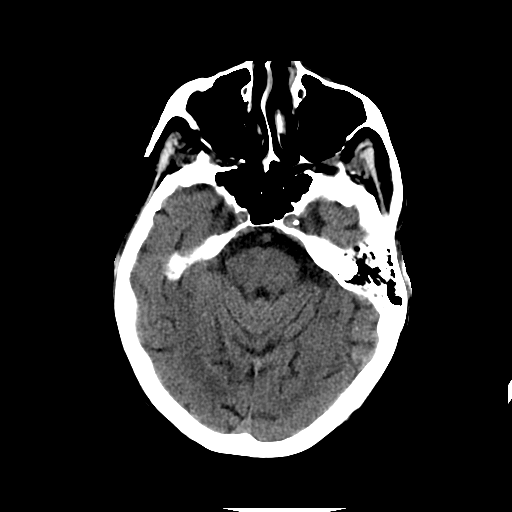
[im 12/33  brain]
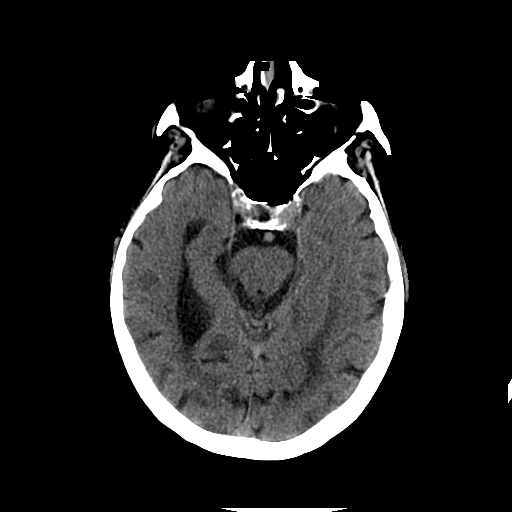
[im 12/33  bone]
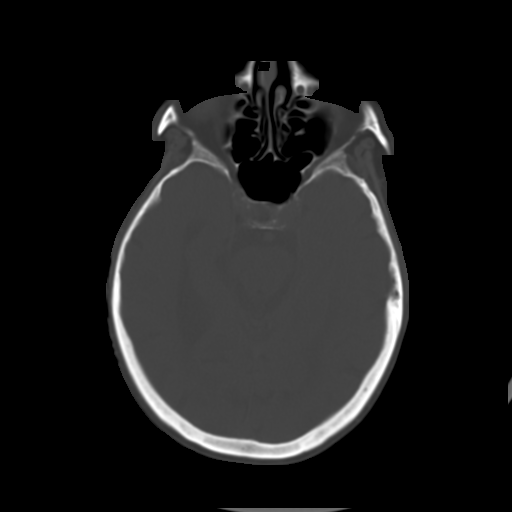
[im 14/33  brain]
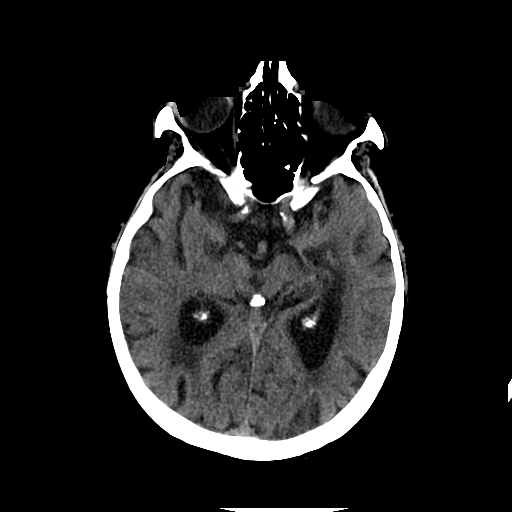
[im 17/33  brain]
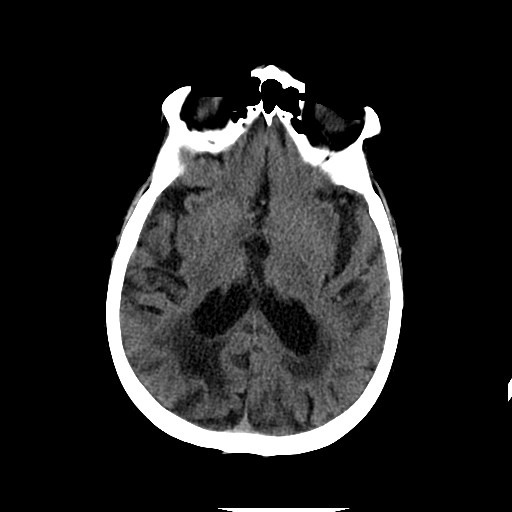
[im 19/33  brain]
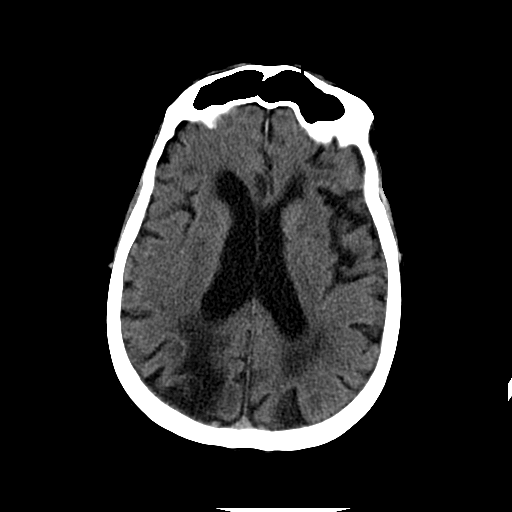
[im 21/33  brain]
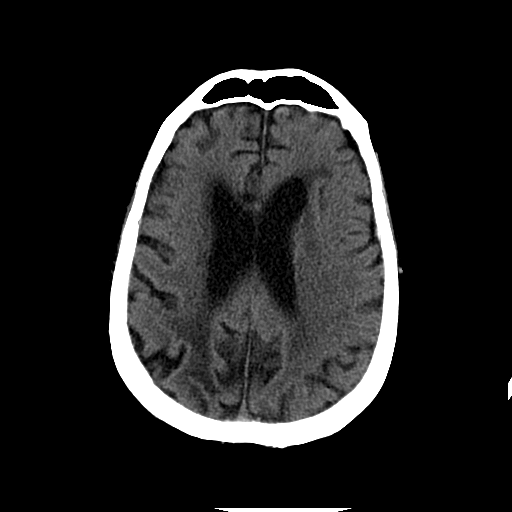
[im 21/33  bone]
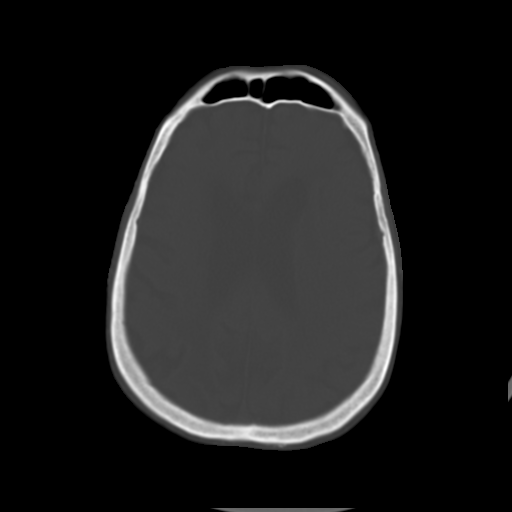
[im 23/33  brain]
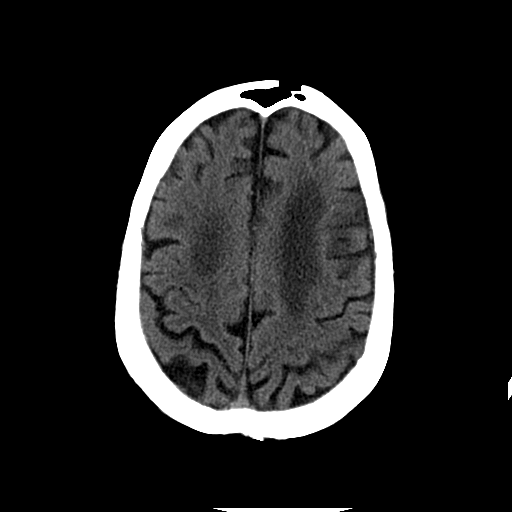
[im 26/33  brain]
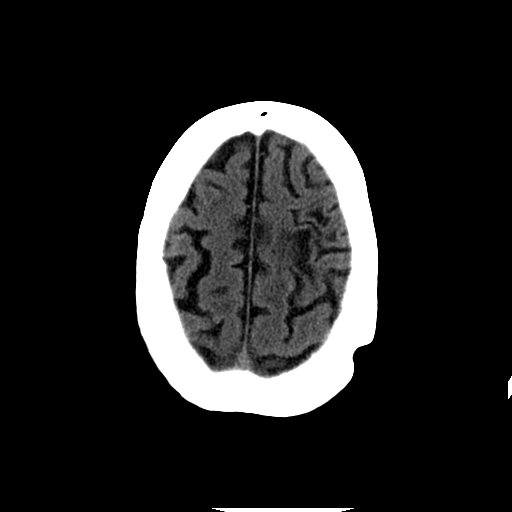
[im 28/33  brain]
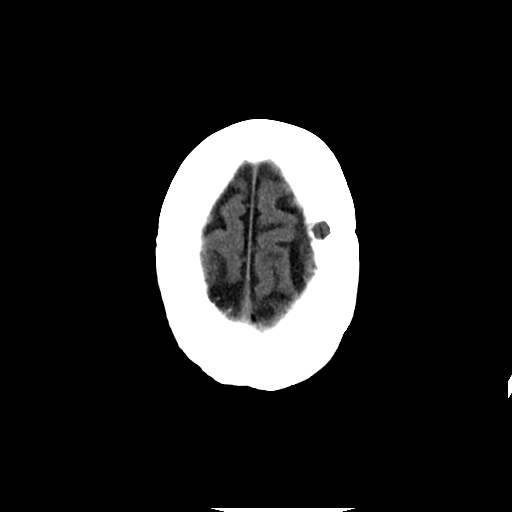
[im 30/33  brain]
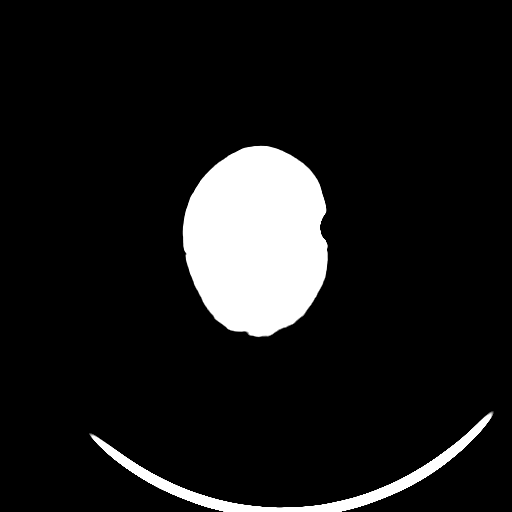
[im 30/33  bone]
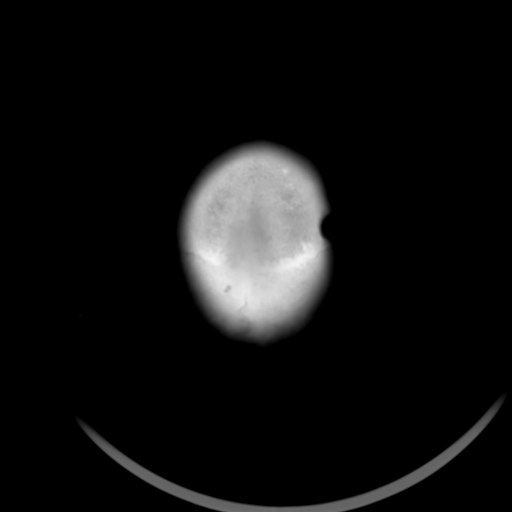

[Series 202: head w/o bone, idose (1) · axial · non-contrast · 0.49mm/px · z∈[+74,+94]mm · 2 of 33 slices shown]
[im 3/33  bone]
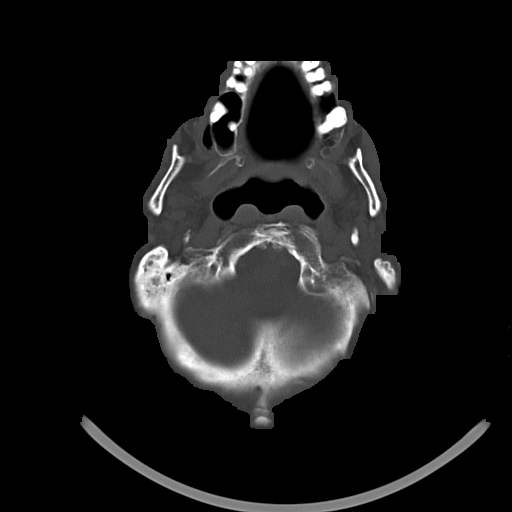
[im 7/33  bone]
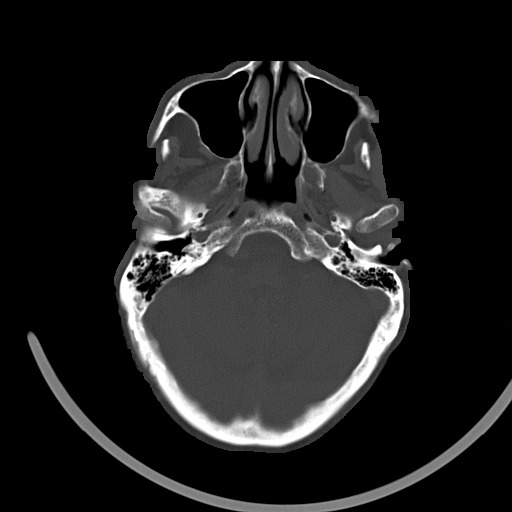

[15 of 30 positions shown; findings below may reference images not displayed]

FINDINGS: No skull fracture is noted. Paranasal sinuses and mastoid air cells
are unremarkable. Again noted burr holes in left parietal region
high convexity.

Left basal ganglia lacunar infarct is stable. Stable old infarct or
postsurgical changes in left parietal region anteriorly. No
intracranial hemorrhage, mass effect or midline shift. Stable old
infarct in right parietal region posteriorly see axial image 19.
Cerebral atrophy again noted. Extensive periventricular and patchy
subcortical white matter decreased attenuation consistent with
chronic small vessel ischemic changes again noted. No definite acute
cortical infarction. No mass lesion is noted on this unenhanced
scan. Atherosclerotic calcifications of carotid siphon.
IMPRESSION: 1. No acute intracranial abnormality. Stable atrophy and extensive
chronic white matter disease. Stable old infarcts as described
above. Postsurgical changes left parietal skull.

## 2017-01-30 IMAGING — US US RENAL
1 series · 14 of 25 positions shown · non-contrast
Comparison: None.

CLINICAL DATA: Acute renal failure

EXAM:
RENAL/URINARY TRACT ULTRASOUND COMPLETE

[Series 1: us renal · 0.22mm/px · 14 of 31 slices shown]
[im 1/31]
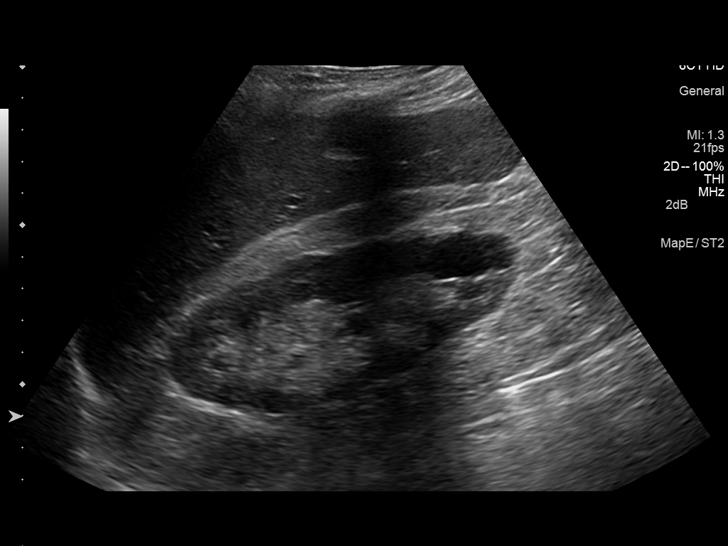
[im 3/31]
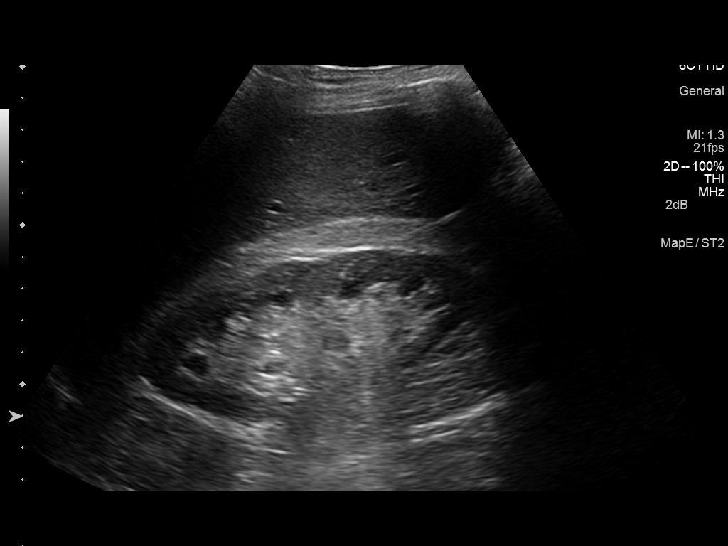
[im 6/31]
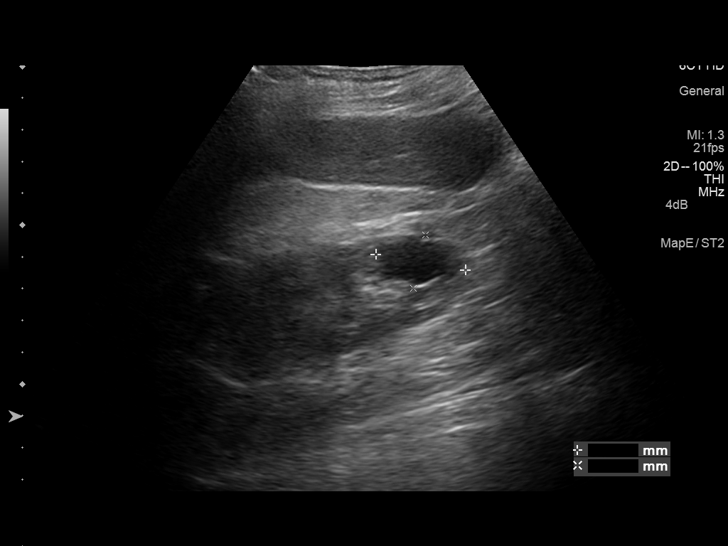
[im 8/31]
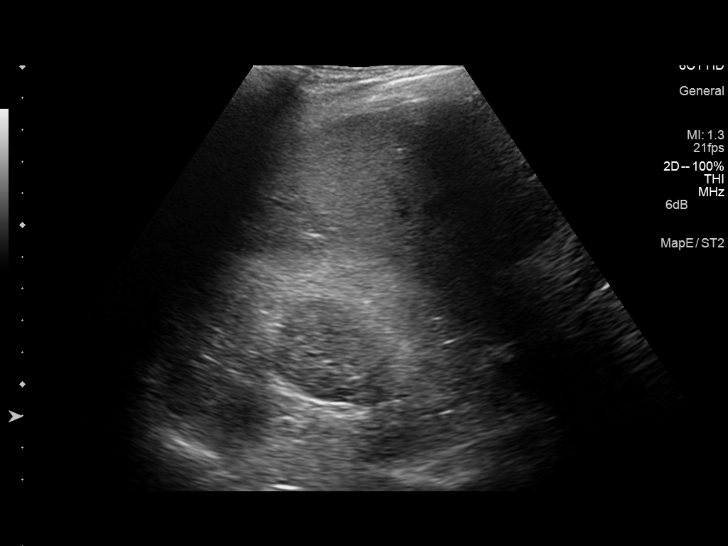
[im 11/31]
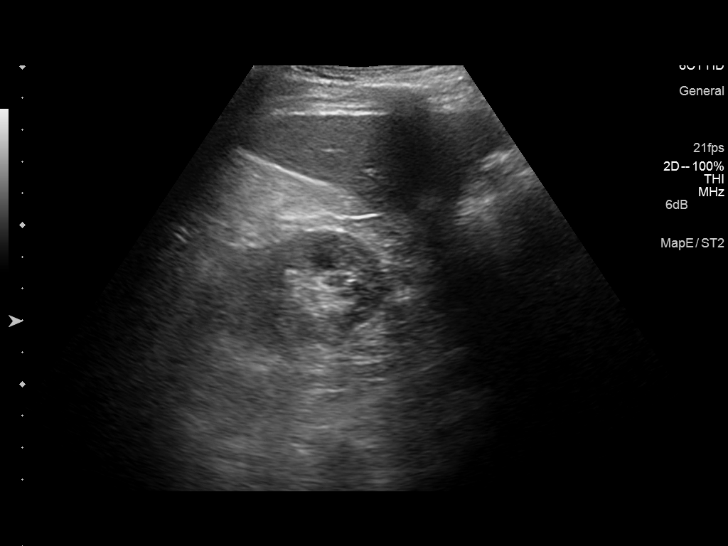
[im 12/31]
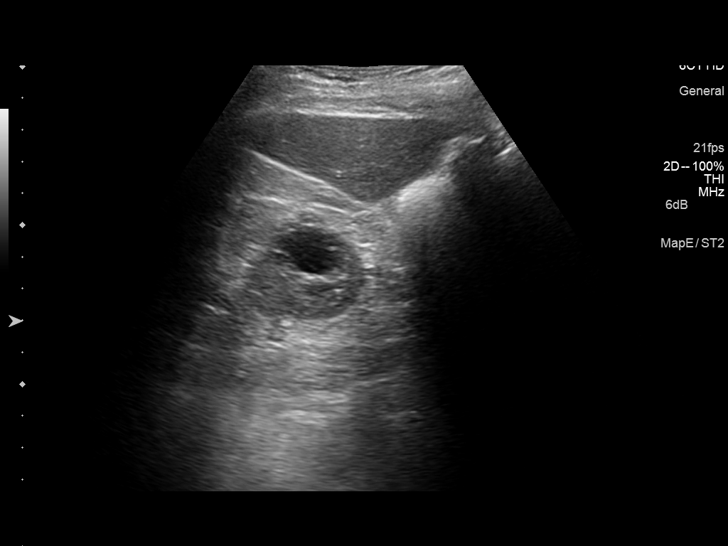
[im 14/31]
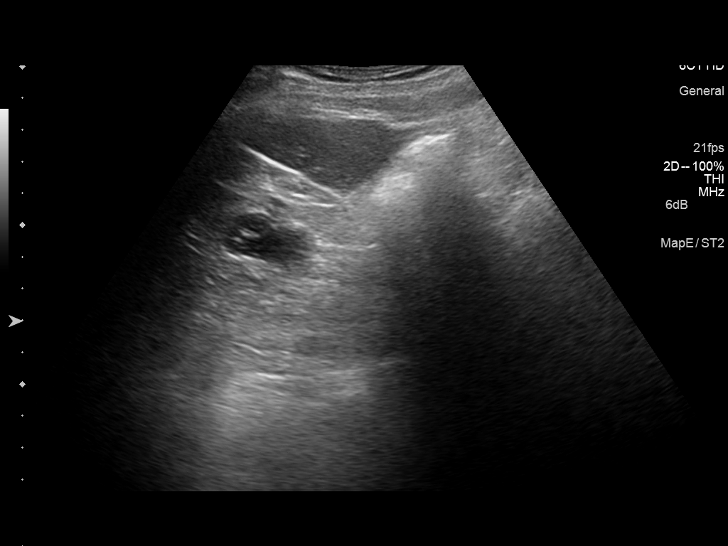
[im 17/31]
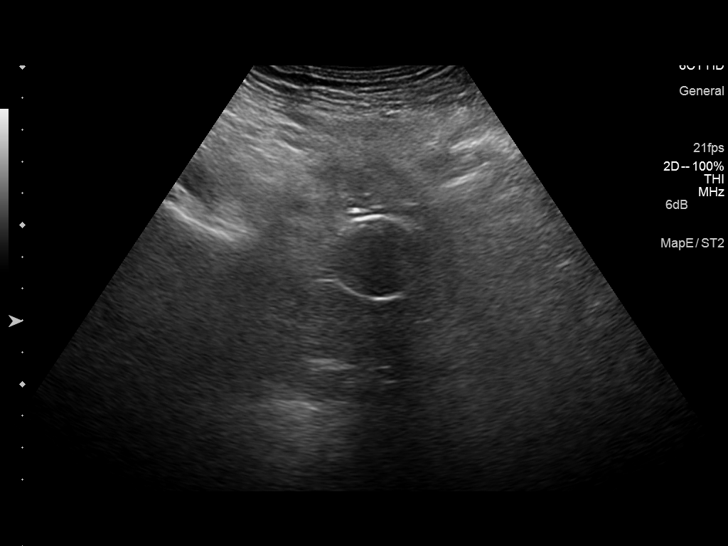
[im 19/31]
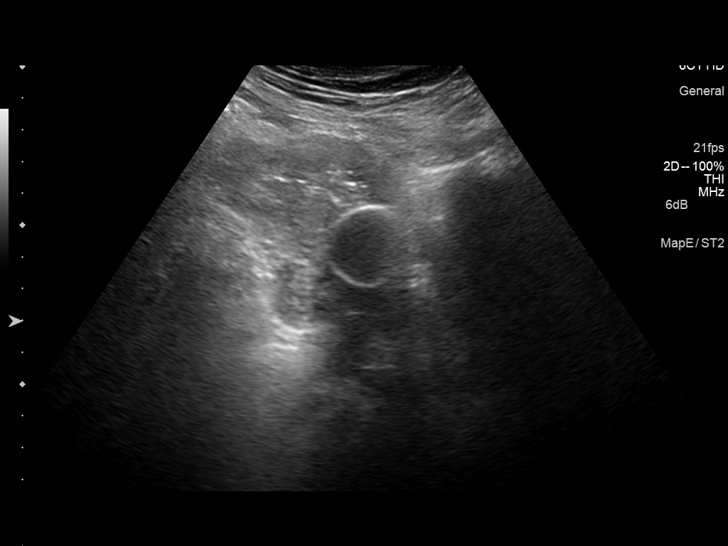
[im 21/31]
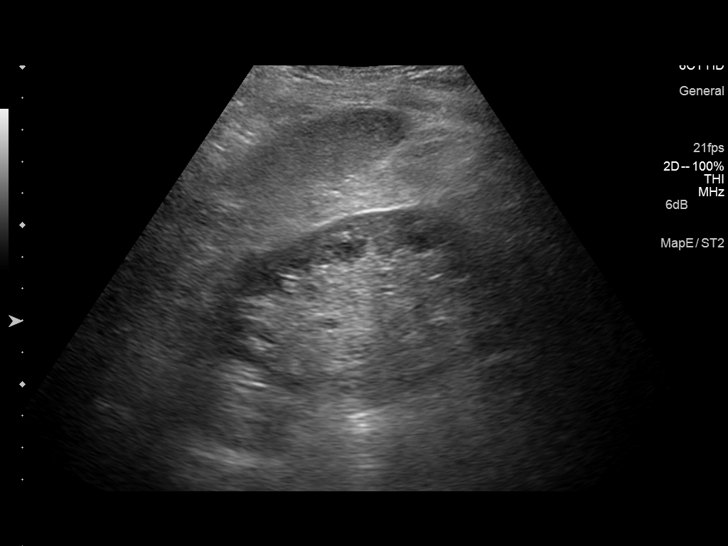
[im 23/31]
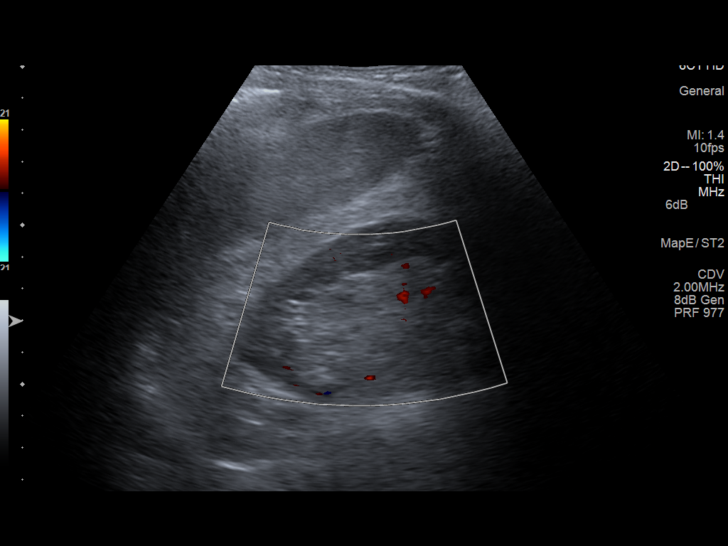
[im 26/31]
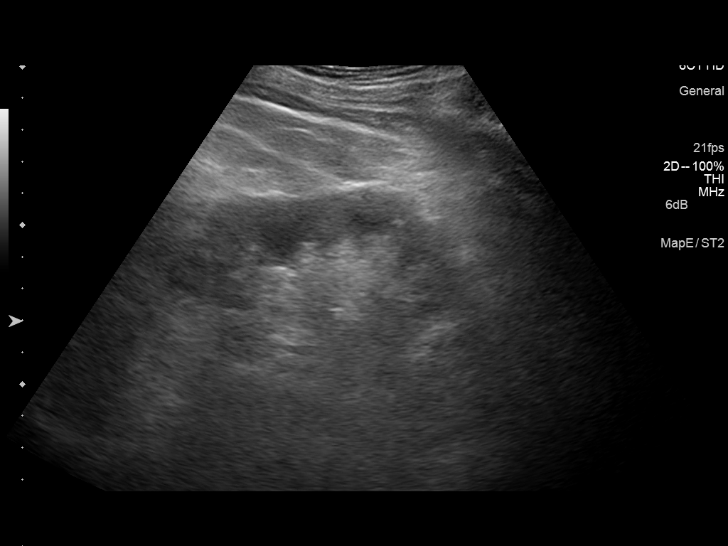
[im 28/31]
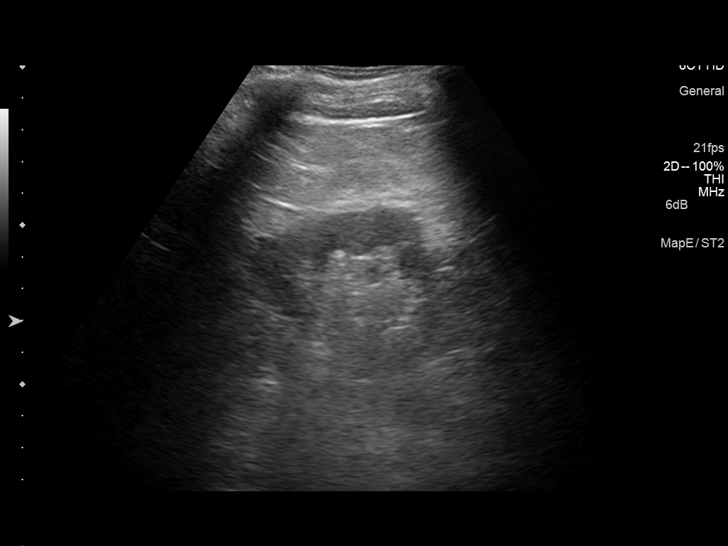
[im 31/31]
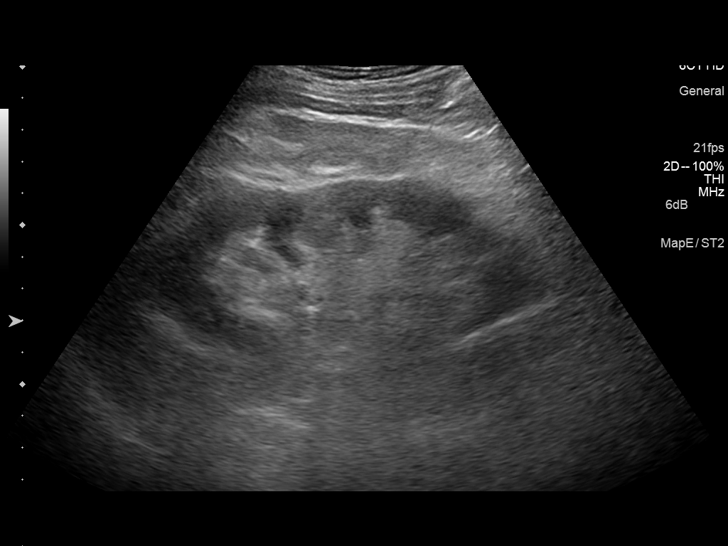

[14 of 25 positions shown; findings below may reference images not displayed]

FINDINGS: Right Kidney:

Length: 11.6 cm. Echogenicity within normal limits. No
hydronephrosis. Lower pole hypoechoic minimally complex septated
cyst with increased through transmission measures 2.9 x 1.7 x
cm.

Left Kidney:

Length: 11.7 cm. Echogenicity within normal limits. No mass or
hydronephrosis visualized.

Bladder:

Decompressed by Foley.
IMPRESSION: 2.9 cm right lower pole minimally complex cyst.

No hydronephrosis or acute finding.
